# Patient Record
Sex: Male | Born: 1995 | Race: White | Hispanic: No
Health system: Western US, Academic
[De-identification: ages and names within clinical notes are randomized; demographics above are authoritative.]

## PROBLEM LIST (undated history)

## (undated) DEATH — deceased

---

## 2015-05-12 ENCOUNTER — Encounter (INDEPENDENT_AMBULATORY_CARE_PROVIDER_SITE_OTHER): Payer: No Typology Code available for payment source | Admitting: Family Practice

## 2015-05-12 ENCOUNTER — Ambulatory Visit (INDEPENDENT_AMBULATORY_CARE_PROVIDER_SITE_OTHER): Payer: No Typology Code available for payment source | Admitting: Family Practice

## 2015-05-12 ENCOUNTER — Ambulatory Visit (INDEPENDENT_AMBULATORY_CARE_PROVIDER_SITE_OTHER): Payer: No Typology Code available for payment source

## 2015-05-12 ENCOUNTER — Encounter (INDEPENDENT_AMBULATORY_CARE_PROVIDER_SITE_OTHER): Payer: Self-pay | Admitting: Family Practice

## 2015-05-12 VITALS — BP 130/73 | HR 71 | Temp 97.9°F | Ht 69.5 in | Wt 150.6 lb

## 2015-05-12 DIAGNOSIS — R002 Palpitations: Secondary | ICD-10-CM

## 2015-05-12 NOTE — Progress Notes (Signed)
Craig Newman is a 20 year old male here to discuss the following:   Chief Complaint   Patient presents with   . Other     patient is here c/o of palpatation started through out this second quarter. Experiencing everytime goes to bed laying down.        1 month hx of palpitations at rest and noticing heart beat more when he lays down to sleep.  He has had no chest pain.  No dizziness.  He does not notice symptoms if he exercises.  There is no fam hx of heart disease.    He drinks alcohol on weekends but not change in pattern.  No other drugs.  Caffeine once in the morning.    He has taken Guanfacine since childhood for tick.      Fam hx negative for hear disease.    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    He has been on triprolidone and pseudoephedrine combo since summer prescribed by his dermatologist for recurrent heat rash on chest.      Medications  Outpatient Prescriptions Marked as Taking for the 05/12/15 encounter (Office Visit) with Susy Frizzle., Sol Passer, MD   Medication Sig Dispense Refill   . GuanFACINE HCl 1 MG Oral Tab Take 1 mg by mouth 2 times a day.     . Triprolidine-Pseudoephedrine (ANTIHISTAMINE OR)          Social history:  History   Sexual Activity   . Sexual Activity: No     Social History   Substance Use Topics   . Smoking status: Never Smoker    . Smokeless tobacco: Never Used   . Alcohol Use: Yes      Comment: Occasionally       Objective:  BP 130/73 mmHg  Pulse 71  Temp(Src) 97.9 F (36.6 C) (Oral)  Ht 5' 9.5" (1.765 m)  Wt 68.312 kg (150 lb 9.6 oz)  BMI 21.93 kg/m2  Estimated body mass index is 21.93 kg/(m^2) as calculated from the following:    Height as of this encounter: 5' 9.5" (1.765 m).    Weight as of this encounter: 68.312 kg (150 lb 9.6 oz).  Extended Vitals not filed for this encounter.  Last blood pressure recorded in this encounter:  130/73  General:  NAD  Weight:  without significant change.  Chest:  Clear without rales or wheezes.  COR:   HR regular.  No murmur, gallop or rub.  No JVD.  No murmur in LLD position.  No murmur when standing  Extr: No edema.      Lab and other tests:  EKG normal    Assessment and Plan:  (R00.2) Palpitations  (primary encounter diagnosis)  Recurrent minor palpitations in the last month in patient who is on guanfacine for many years and on antihistamine-pseudoephedrine combination since last summer.  Importantly symptoms do not occur during activity and EKG is normal.  I do not thing he needs additional evaluation at this time.      Although not discussed at visit, I realize that he is taking pseudoephedrine in the antihistmine combination.  This may play a role even though he has been on this since summer without symptoms.  Pseudoephedrine is a heart stimulant and decongestant.  I advise switching to a different antihistamine.  He can contact his dermatologist or he could just take Zyrtec (cetrazine) 10 mg daily which is available without a prescription.  Thalia Bloodgood, MD  Clinical Professor   Department of [employer] [place]

## 2015-05-12 NOTE — Progress Notes (Signed)
SUBJECTIVE:This 20 year old male presents with c/o feeling his "heart speed up" in increasing amount since beginning of January.  States feeling is more pronounced when lays down for nap, however does not feel it when lays down at night.  Unable to say how long the feeling lasts or how many times/day it occurs.  Denies concomitant SOB, dizziness.  States yesterday he had some soreness in left side of chest when the feeling occurred.  Takes Guanfacine for eye tick.    OBJECTIVE:  Well appearing young man in NAD    ASSESSMENT:   Possible heart palpitations    PLAN:  Appointment scheduled for evaluations with Edrick Kins, MD today at 4:20pm.  Discussed parameters to seek care urgently.    Patient voiced understanding and agreement with plan.    Claudette Stapler, RN

## 2015-06-28 ENCOUNTER — Emergency Department
Admission: EM | Admit: 2015-06-28 | Discharge: 2015-06-29 | Disposition: A | Discharge status: Home / Self Care | Payer: No Typology Code available for payment source | Attending: Emergency Medicine | Admitting: Emergency Medicine

## 2015-06-28 DIAGNOSIS — S81812A Laceration without foreign body, left lower leg, initial encounter: Secondary | ICD-10-CM | POA: Insufficient documentation

## 2015-06-28 DIAGNOSIS — W25XXXA Contact with sharp glass, initial encounter: Secondary | ICD-10-CM

## 2015-06-28 DIAGNOSIS — M79662 Pain in left lower leg: Secondary | ICD-10-CM | POA: Insufficient documentation

## 2015-06-28 DIAGNOSIS — Z23 Encounter for immunization: Secondary | ICD-10-CM

## 2015-06-28 DIAGNOSIS — F10129 Alcohol abuse with intoxication, unspecified: Secondary | ICD-10-CM

## 2015-06-29 ENCOUNTER — Emergency Department (EMERGENCY_DEPARTMENT_HOSPITAL)
Admission: EM | Admit: 2015-06-29 | Discharge: 2015-06-29 | Payer: No Typology Code available for payment source | Source: Home / Self Care

## 2015-06-29 ENCOUNTER — Other Ambulatory Visit: Payer: Self-pay | Admitting: Emergency Medicine

## 2015-06-29 DIAGNOSIS — M79662 Pain in left lower leg: Secondary | ICD-10-CM

## 2015-06-30 ENCOUNTER — Emergency Department
Admission: EM | Admit: 2015-06-30 | Discharge: 2015-06-30 | Disposition: A | Discharge status: Home / Self Care | Payer: No Typology Code available for payment source | Attending: Emergency Medicine | Admitting: Emergency Medicine

## 2015-06-30 DIAGNOSIS — S81812D Laceration without foreign body, left lower leg, subsequent encounter: Secondary | ICD-10-CM

## 2015-06-30 DIAGNOSIS — W25XXXD Contact with sharp glass, subsequent encounter: Secondary | ICD-10-CM | POA: Insufficient documentation

## 2015-07-08 ENCOUNTER — Ambulatory Visit (INDEPENDENT_AMBULATORY_CARE_PROVIDER_SITE_OTHER): Payer: No Typology Code available for payment source

## 2015-07-08 NOTE — Progress Notes (Signed)
TRIAGE WOUND CARE/DRESSING CHANGE    SUBJECTIVE:  Craig Newman is a 20 year old male presents for wound care of Left leg.  Patient had sutures placed on 06/28/15, see mindscape for full details.  Patient reports that the wound continues to bleed and wondering if he can have [facility]    OBJECTIVE:   Wound is covered in crusted blood, begins to bleed with cleansing  6 sutures in place plus 2 mattress sutures   Wound is without redness or discharge  Tissue around wound is tender to palpation, +1 edema, healing bruise.    ASSESSMENT:   Suture care.    PLAN:    Wound was cleansed with normal saline.    Consulted with Dr. Delbert Phenixarle who advises that sutures stay in place x 14 days total.  Applied 3 steristrip to wound, leave in place.  Advised pt to begin pedal pulses, manual lymphatic drainage, increase fluid intake, wash wound daily in shower.  Pt will RTC on 07/12/15 for further evaluation and possible suture removal.  RTC sooner if any further concern.  Pt agreed to plan.    NE Lantryhaves, [place]

## 2015-07-12 ENCOUNTER — Ambulatory Visit (INDEPENDENT_AMBULATORY_CARE_PROVIDER_SITE_OTHER): Payer: No Typology Code available for payment source

## 2015-07-12 NOTE — Progress Notes (Signed)
SUTURE REMOVAL TEMPLATE for TRIAGE     SUBJECTIVE:  Craig Newman is a 20 year old male who presents for removal of sutures/staples on left lateral calf.  [facility] of [facility],  14 days ago. See mindscape for details.  Patient has had bleeding from sutures since onset.   2 mattress sutures placed 2 days after original interrupted sutures placed.    OBJECTIVE:  Today there are 6 interrupted and 2 mattress sutures/staples in place.    Wound is crusted in blood, after cleansing, noted that wound edges appear to be approximated  Minimal peri wound erythema as expected without exudate.    ASSESSMENT:  Healing wound.    PLAN:  Consulted with Dr. Merla Richesosellini, OK to remove sutures now.  All sutures/staples were removed without difficulty.    Small amount of bleeding after suture removal, staunched with pressure.  Applied vaseline to wound, applied steri-strips for extra hold, allow to fall off.  Reviewed continued self care instructions:Monitor for signs of infection (fever, pus at wound site +/- increasing redness).  No further follow up is anticipated at this time.    Pt voiced understanding and agreement with plan.    Sherilyn CooterNadine Elizabeth Chaves, RN

## 2017-04-24 ENCOUNTER — Ambulatory Visit (INDEPENDENT_AMBULATORY_CARE_PROVIDER_SITE_OTHER): Payer: No Typology Code available for payment source

## 2017-04-24 DIAGNOSIS — Z719 Counseling, unspecified: Secondary | ICD-10-CM

## 2017-04-24 DIAGNOSIS — Z23 Encounter for immunization: Secondary | ICD-10-CM

## 2017-04-24 NOTE — Progress Notes (Signed)
Craig Newman is here today to get his second HPV vaccine.  Vaccine Screening Questions    Interpreter: No    1. Are you allergic to Latex? NO    2.  Have you had a serious reaction or an allergic reaction to a vaccine?  NO    3.  Currently have a moderate or severe illness, including fever?  NO    4.  Ever had a seizure or any neurological problem associated with a vaccine? (DTaP/TDaP/DTP pertinent) NO    5.  Is patient receiving any live vaccinations today? (Varicella-Chickenpox, MMR-Measles/Mumps/Rubella, Zoster-Shingles, Flumist, Yellow Fever) NOTE: oral rotavirus is exempt  NO    If YES to any of the questions above - Do NOT give vaccine.  Consult with RN or provider in clinic.  (#5 can be YES if all Live vaccine questions are answered NO)    If NO to all questions above - Patient may receive vaccine.    6.  Do you need to receive the Flu vaccine today? NO    HPV 9-valent (Gardasil 9)    All patients are encouraged to wait 15 minutes before leaving after receiving any vaccine.    VIS given 03/22/2017 by Luvenia Heller, RN    Was Craig Newman alert, oriented, and without adverse reaction after the vaccine was given y/n: YES.

## 2017-09-19 ENCOUNTER — Encounter (INDEPENDENT_AMBULATORY_CARE_PROVIDER_SITE_OTHER): Payer: Self-pay

## 2018-02-06 ENCOUNTER — Ambulatory Visit (INDEPENDENT_AMBULATORY_CARE_PROVIDER_SITE_OTHER): Payer: No Typology Code available for payment source

## 2018-02-06 DIAGNOSIS — Z23 Encounter for immunization: Secondary | ICD-10-CM

## 2018-02-06 DIAGNOSIS — Z719 Counseling, unspecified: Secondary | ICD-10-CM

## 2018-02-06 NOTE — Progress Notes (Signed)
Vaccine Screening Questions      Does the patient have allergies to foods (particularly yeast and eggs), latex, gentamicin sulfate or any known vaccine component?  No    Has the patient had an opportunity to ask questions about the HPV and Flu vaccines?YES     Is the patient sick today? NO    Is the patient pregnant or planning to become pregnant in the next month? N/A    Have you had Guillan-Barre syndrome associated with a vaccine? No    Have you ever had a Flu vaccine? Yes    Have you ever had a serious reaction to influenza vaccine in the past? No    Has patient or guardian given consent and received the corresponding VIS statements? YES    Vaccine being given today: HPV 9  (Gardasil 9) &  Influenza Quadrivalent.    Adolescents are encouraged to wait 15 minutes after receiving vaccine before leaving.

## 2018-10-06 ENCOUNTER — Encounter (HOSPITAL_COMMUNITY): Payer: Self-pay

## 2018-10-06 ENCOUNTER — Other Ambulatory Visit: Payer: Self-pay

## 2018-10-06 ENCOUNTER — Inpatient Hospital Stay (HOSPITAL_COMMUNITY)
Admission: EM | Admit: 2018-10-06 | Discharge: 2018-10-08 | DRG: 494 | Disposition: A | Discharge status: Home Health | Payer: BC Managed Care – PPO | Attending: Orthopaedic Surgery | Admitting: Orthopaedic Surgery

## 2018-10-06 ENCOUNTER — Emergency Department (HOSPITAL_COMMUNITY): Payer: BC Managed Care – PPO

## 2018-10-06 DIAGNOSIS — S82202A Unspecified fracture of shaft of left tibia, initial encounter for closed fracture: Secondary | ICD-10-CM | POA: Diagnosis present

## 2018-10-06 DIAGNOSIS — Y9301 Activity, walking, marching and hiking: Secondary | ICD-10-CM | POA: Diagnosis present

## 2018-10-06 DIAGNOSIS — Z1159 Encounter for screening for other viral diseases: Secondary | ICD-10-CM | POA: Diagnosis not present

## 2018-10-06 DIAGNOSIS — F17210 Nicotine dependence, cigarettes, uncomplicated: Secondary | ICD-10-CM | POA: Diagnosis present

## 2018-10-06 DIAGNOSIS — S82492A Other fracture of shaft of left fibula, initial encounter for closed fracture: Secondary | ICD-10-CM | POA: Diagnosis present

## 2018-10-06 DIAGNOSIS — Z419 Encounter for procedure for purposes other than remedying health state, unspecified: Secondary | ICD-10-CM

## 2018-10-06 DIAGNOSIS — W1781XA Fall down embankment (hill), initial encounter: Secondary | ICD-10-CM

## 2018-10-06 DIAGNOSIS — S82242A Displaced spiral fracture of shaft of left tibia, initial encounter for closed fracture: Secondary | ICD-10-CM | POA: Diagnosis present

## 2018-10-06 LAB — CBC
HCT: 43.2 % (ref 39.0–52.0)
Hemoglobin: 15.5 g/dL (ref 13.0–17.0)
MCH: 31.2 pg (ref 26.0–34.0)
MCHC: 35.9 g/dL (ref 30.0–36.0)
MCV: 86.9 fL (ref 80.0–100.0)
Platelets: 256 10*3/uL (ref 150–400)
RBC: 4.97 MIL/uL (ref 4.22–5.81)
RDW: 11.6 % (ref 11.5–15.5)
WBC: 9.5 10*3/uL (ref 4.0–10.5)
nRBC: 0 % (ref 0.0–0.2)

## 2018-10-06 LAB — BASIC METABOLIC PANEL
Anion gap: 14 (ref 5–15)
BUN: 14 mg/dL (ref 6–20)
CO2: 23 mmol/L (ref 22–32)
Calcium: 9.3 mg/dL (ref 8.9–10.3)
Chloride: 102 mmol/L (ref 98–111)
Creatinine, Ser: 0.98 mg/dL (ref 0.61–1.24)
GFR calc Af Amer: >60 mL/min (ref >60)
GFR calc non Af Amer: >60 mL/min (ref >60)
Glucose, Bld: 109 mg/dL — ABNORMAL HIGH (ref 70–99)
Potassium: 4.2 mmol/L (ref 3.5–5.1)
Sodium: 139 mmol/L (ref 135–145)

## 2018-10-06 LAB — SARS CORONAVIRUS 2 BY RT PCR (HOSPITAL ORDER, PERFORMED IN ~~LOC~~ HOSPITAL LAB): SARS Coronavirus 2: NEGATIVE

## 2018-10-06 LAB — SURGICAL PCR SCREEN
MRSA, PCR: NEGATIVE
Staphylococcus aureus: NEGATIVE

## 2018-10-06 MED ORDER — ACETAMINOPHEN 500 MG PO TABS
1000.0000 mg | ORAL_TABLET | Freq: Four times a day (QID) | ORAL | Status: AC
  Administered 2018-10-06 – 2018-10-07 (×2): 1000 mg via ORAL
  Filled 2018-10-06: qty 2

## 2018-10-06 MED ORDER — CEFAZOLIN SODIUM-DEXTROSE 2-4 GM/100ML-% IV SOLN
2.0000 g | INTRAVENOUS | Status: AC
  Administered 2018-10-07: 2 g via INTRAVENOUS
  Filled 2018-10-06: qty 100

## 2018-10-06 MED ORDER — NAPROXEN 250 MG PO TABS
250.0000 mg | ORAL_TABLET | Freq: Two times a day (BID) | ORAL | Status: DC
  Administered 2018-10-06 – 2018-10-08 (×3): 250 mg via ORAL
  Filled 2018-10-06 (×5): qty 1

## 2018-10-06 MED ORDER — HYDROMORPHONE HCL 1 MG/ML IJ SOLN
0.5000 mg | INTRAMUSCULAR | Status: DC | PRN
  Administered 2018-10-07 (×2): 1 mg via INTRAVENOUS
  Filled 2018-10-06 (×2): qty 1

## 2018-10-06 MED ORDER — METHOCARBAMOL 1000 MG/10ML IJ SOLN
500.0000 mg | Freq: Four times a day (QID) | INTRAVENOUS | Status: DC | PRN
  Filled 2018-10-06: qty 5

## 2018-10-06 MED ORDER — MORPHINE SULFATE (PF) 4 MG/ML IV SOLN
4.0000 mg | Freq: Once | INTRAVENOUS | Status: AC
  Administered 2018-10-06: 11:00:00 4 mg via INTRAVENOUS
  Filled 2018-10-06: qty 1

## 2018-10-06 MED ORDER — OXYCODONE HCL 5 MG PO TABS
5.0000 mg | ORAL_TABLET | ORAL | Status: DC | PRN
  Administered 2018-10-06: 5 mg via ORAL
  Administered 2018-10-06 – 2018-10-08 (×5): 10 mg via ORAL
  Filled 2018-10-06: qty 1
  Filled 2018-10-06 (×5): qty 2

## 2018-10-06 MED ORDER — ACETAMINOPHEN 325 MG PO TABS: 325.0000 mg | ORAL_TABLET | Freq: Four times a day (QID) | ORAL | Status: DC | PRN

## 2018-10-06 MED ORDER — DOCUSATE SODIUM 100 MG PO CAPS
100.0000 mg | ORAL_CAPSULE | Freq: Two times a day (BID) | ORAL | Status: DC
  Administered 2018-10-06 – 2018-10-08 (×3): 100 mg via ORAL
  Filled 2018-10-06 (×3): qty 1

## 2018-10-06 MED ORDER — ONDANSETRON HCL 4 MG/2ML IJ SOLN: 4.0000 mg | Freq: Four times a day (QID) | INTRAMUSCULAR | Status: DC | PRN

## 2018-10-06 MED ORDER — ONDANSETRON HCL 4 MG PO TABS: 4.0000 mg | ORAL_TABLET | Freq: Four times a day (QID) | ORAL | Status: DC | PRN

## 2018-10-06 MED ORDER — METHOCARBAMOL 500 MG PO TABS
500.0000 mg | ORAL_TABLET | Freq: Four times a day (QID) | ORAL | Status: DC | PRN
  Administered 2018-10-06 – 2018-10-08 (×5): 500 mg via ORAL
  Filled 2018-10-06 (×5): qty 1

## 2018-10-06 MED ORDER — OXYCODONE HCL 5 MG PO TABS
10.0000 mg | ORAL_TABLET | ORAL | Status: DC | PRN
  Administered 2018-10-08 (×2): 15 mg via ORAL
  Filled 2018-10-06 (×2): qty 3

## 2018-10-06 NOTE — ED Triage Notes (Signed)
EMS reports Pt fell down hill, obvious deformity and swelling left lower leg. Denies LOC, striking head or any other pain  BP 150/90 HR 90 RR 18 Sp02 100 RA  20ga LAC 100 Fentanyl enroute

## 2018-10-06 NOTE — Anesthesia Preprocedure Evaluation (Addendum)
Anesthesia Evaluation  Patient identified by MRN, date of birth, ID band Patient awake    Reviewed: Allergy & Precautions, H&P , NPO status , Patient's Chart, lab work & pertinent test results  Airway Mallampati: II  TM Distance: >3 FB Neck ROM: Full    Dental no notable dental hx. (+) Teeth Intact, Dental Advisory Given   Pulmonary Current Smoker,    Pulmonary exam normal breath sounds clear to auscultation       Cardiovascular Exercise Tolerance: Good negative cardio ROS   Rhythm:Regular Rate:Normal     Neuro/Psych negative neurological ROS  negative psych ROS   GI/Hepatic negative GI ROS, Neg liver ROS,   Endo/Other  negative endocrine ROS  Renal/GU negative Renal ROS  negative genitourinary   Musculoskeletal   Abdominal   Peds  Hematology negative hematology ROS (+)   Anesthesia Other Findings   Reproductive/Obstetrics negative OB ROS                            Anesthesia Physical Anesthesia Plan  ASA: II  Anesthesia Plan: General   Post-op Pain Management:    Induction: Intravenous  PONV Risk Score and Plan: 2 and Ondansetron, Dexamethasone and Midazolam  Airway Management Planned: Oral ETT  Additional Equipment:   Intra-op Plan:   Post-operative Plan: Extubation in OR  Informed Consent: I have reviewed the patients History and Physical, chart, labs and discussed the procedure including the risks, benefits and alternatives for the proposed anesthesia with the patient or authorized representative who has indicated his/her understanding and acceptance.     Dental advisory given  Plan Discussed with: CRNA  Anesthesia Plan Comments:         Anesthesia Quick Evaluation

## 2018-10-06 NOTE — ED Notes (Signed)
ED TO INPATIENT HANDOFF REPORT  Name/Age/Gender Dennis Dixon 23 y.o. male  Code Status    Code Status Orders  (From admission, onward)         Start     Ordered   10/06/18 1053  Full code  Continuous     10/06/18 1055        Code Status History    This patient has a current code status but no historical code status.   Advance Care Planning Activity      Home/SNF/Other Home  Chief Complaint Fall; Ankle Injury  Level of Care/Admitting Diagnosis ED Disposition    ED Disposition Condition Comment   Admit  [facility] [[id number]]  Level of Care: Med-Surg [16]  Covid Evaluation: Asymptomatic Screening Protocol (No Symptoms)  Diagnosis: Left tibial fracture [[id number]][[id number]]  Admitting Physician: Terance HartADAIR, CHRISTOPHER R [[id number]]  Attending Physician: Terance HartDAIR, CHRISTOPHER R [[id number]]  Estimated length of stay: past midnight tomorrow  Certification:: I certify this patient will need inpatient services for at least 2 midnights  PT Class (Do Not Modify): Inpatient [101]  PT Acc Code (Do Not Modify): Private [1]       Medical History History reviewed. No pertinent past medical history.  Allergies Allergies  Allergen Reactions  . Amoxicillin Itching    IV Location/Drains/Wounds Patient Lines/Drains/Airways Status   Active Line/Drains/Airways    Name:   Placement date:   Placement time:   Site:   Days:   Peripheral IV 10/06/18 Left Antecubital   10/06/18    0936    Antecubital   less than 1          Labs/Imaging Results for orders placed or performed during the hospital encounter of 10/06/18 (from the past 48 hour(s))  CBC     Status: None   Collection Time: 10/06/18 10:53 AM  Result Value Ref Range   WBC 9.5 4.0 - 10.5 K/uL   RBC 4.97 4.22 - 5.81 MIL/uL   Hemoglobin 15.5 13.0 - 17.0 g/dL   HCT 29.543.2 62.139.0 - 30.852.0 %   MCV 86.9 80.0 - 100.0 fL   MCH 31.2 26.0 - 34.0 pg   MCHC 35.9 30.0 - 36.0 g/dL   RDW 65.711.6 84.611.5 - 96.215.5 %   Platelets 256  150 - 400 K/uL   nRBC 0.0 0.0 - 0.2 %    Comment: Performed at [facility], [address] [place], [place] [address]  Basic metabolic panel     Status: Abnormal   Collection Time: 10/06/18 10:53 AM  Result Value Ref Range   Sodium 139 135 - 145 mmol/L   Potassium 4.2 3.5 - 5.1 mmol/L   Chloride 102 98 - 111 mmol/L   CO2 23 22 - 32 mmol/L   Glucose, Bld 109 (H) 70 - 99 mg/dL   BUN 14 6 - 20 mg/dL   Creatinine, Ser 1.320.98 0.61 - 1.24 mg/dL   Calcium 9.3 8.9 - 44.010.3 mg/dL   GFR calc non Af Amer >60 >60 mL/min   GFR calc Af Amer >60 >60 mL/min   Anion gap 14 5 - 15    Comment: Performed at [facility], [address] [place], [place] [address]   Dg Tibia/fibula Left  Result Date: 10/06/2018 CLINICAL DATA:  Fall with left lower extremity pain EXAM: LEFT TIBIA AND FIBULA - 2 VIEW COMPARISON:  None. FINDINGS: Comminuted non articular oblique left distal tibial metadiaphysis fracture with 1 cm over riding and 9 mm  posterior/lateral displacement of the dominant distal fracture fragment. Comminuted oblique left distal fibula fracture extending into the lateral malleolus with 5 mm posterior displacement of the dominant distal fracture fragment and 1 cm overriding. No additional fractures. No dislocation at the knee or ankle. No suspicious focal osseous lesions. No radiopaque foreign body. Soft tissue swelling surrounding the fracture sites. IMPRESSION: Comminuted left distal tibial and left distal fibula fractures as detailed. Electronically Signed   By: Ilona Sorrel M.D.   On: 10/06/2018 10:22   Dg Ankle Complete Left  Result Date: 10/06/2018 CLINICAL DATA:  Fall today with left ankle pain EXAM: LEFT ANKLE COMPLETE - 3+ VIEW COMPARISON:  None. FINDINGS: Comminuted non articular spiral fracture of the distal metadiaphysis of the tibia with 1 cm over riding, 8 mm posterior and lateral displacement of the dominant distal fracture fragment and slight apex lateral  angulation. Comminuted oblique left distal fibula fracture extending into lateral malleolus with 4 mm posterior displacement of the dominant distal fracture fragment and 1 cm overriding. No subluxation at the left ankle joint. No suspicious focal osseous lesions. Soft tissue swelling surrounding the fracture sites. No radiopaque foreign body. IMPRESSION: 1. Comminuted spiral fracture of the distal left tibial metadiaphysis as detailed. 2. Comminuted oblique left distal fibula fracture as detailed. Electronically Signed   By: Ilona Sorrel M.D.   On: 10/06/2018 10:19    Pending Labs Unresulted Labs (From admission, onward)    Start     Ordered   10/06/18 1054  SARS Coronavirus 2 (CEPHEID - Performed in [place] hospital lab), Hosp Order  (Asymptomatic Patients Labs)  Once,   STAT    Question:  Rule Out  Answer:  Yes   10/06/18 1053   10/06/18 1053  HIV antibody (Routine Testing)  Once,   STAT     10/06/18 1055          Vitals/Pain Today's Vitals   10/06/18 0934 10/06/18 0935 10/06/18 1053  BP: (!) 152/108  (!) 148/98  Pulse: 89  88  Resp: 18  18  Temp: 98.2 F (36.8 C)    TempSrc: Oral    SpO2: 100%  99%  Weight:  86.2 kg   Height:  6\' 2"  (1.88 m)   PainSc:  7      Isolation Precautions No active isolations  Medications Medications  acetaminophen (TYLENOL) tablet 325-650 mg (has no administration in time range)  oxyCODONE (Oxy IR/ROXICODONE) immediate release tablet 5-10 mg (has no administration in time range)  oxyCODONE (Oxy IR/ROXICODONE) immediate release tablet 10-15 mg (has no administration in time range)  HYDROmorphone (DILAUDID) injection 0.5-1 mg (has no administration in time range)  acetaminophen (TYLENOL) tablet 1,000 mg (has no administration in time range)  naproxen (NAPROSYN) tablet 250 mg (has no administration in time range)  methocarbamol (ROBAXIN) tablet 500 mg (has no administration in time range)    Or  methocarbamol (ROBAXIN) 500 mg in dextrose 5 %  50 mL IVPB (has no administration in time range)  docusate sodium (COLACE) capsule 100 mg (has no administration in time range)  ondansetron (ZOFRAN) tablet 4 mg (has no administration in time range)    Or  ondansetron (ZOFRAN) injection 4 mg (has no administration in time range)  morphine 4 MG/ML injection 4 mg (4 mg Intravenous Given 10/06/18 1119)    Mobility non-ambulatory

## 2018-10-06 NOTE — Progress Notes (Signed)
1415 Received pt from [facility] via [place]. A&O x4, a little groggy. Left lower leg with spilnt dry and intact. Toes are warm. Notified Dr Lucia Gaskins of pt's arrival to the unit. Dr Lucia Gaskins was able to talk to the pt for the plan of care. I called pt's mother Renard Caperton and updated her of the plan of care.

## 2018-10-06 NOTE — ED Provider Notes (Signed)
Medical screening examination/treatment/procedure(s) were conducted as a shared visit with non-physician practitioner(s) and myself.  I personally evaluated the patient during the encounter.    23 year old male here after mechanical fall prior to arrival.  Has obvious deformity to his left lower extremity.  X-ray confirms a spiral tibial fracture.  Neurovascular intact at the left foot.  Discussed with Ortho will transfer for further management   Lacretia Leigh, MD 10/06/18 1056

## 2018-10-06 NOTE — ED Notes (Signed)
Bed: FM40 Expected date:  Expected time:  Means of arrival:  Comments: Need zap @ [id number]

## 2018-10-06 NOTE — ED Provider Notes (Signed)
[facility]-EMERGENCY DEPT Provider Note   CSN: [id number] Arrival date & time: 10/06/18  0920    History   Chief Complaint Chief Complaint  Patient presents with  . Leg Injury  . Fall    HPI Dennis Dixon is a 23 y.o. male without significant past medical history sending to the ED via EMS with left lower leg pain and deformity after mechanical fall.  Patient states he had picked up breakfast and was walking down a hill to his friend's apartment when he fell.  He states that he will was very steep and this caused him to fall.  He states he rolled his ankle but felt that something was immediately wrong.  He fell onto his bottom, he did not hit his head or pass out.  Is in a c-collar, however he states he has no pain in his neck or back.  He states he has sprained his ankle in the past, however no history of fracture.  100 mcg fentanyl provided in route provided improvement.  He denies any drug or alcohol use recently.     The history is provided by the patient.    History reviewed. No pertinent past medical history.  Patient Active Problem List   Diagnosis Date Noted  . Left tibial fracture 10/06/2018    History reviewed. No pertinent surgical history.      Home Medications    Prior to Admission medications   Not on File    Family History History reviewed. No pertinent family history.  Social History Social History   Tobacco Use  . Smoking status: Current Some Day Smoker  . Smokeless tobacco: Current User  Substance Use Topics  . Alcohol use: Not on file  . Drug use: Not on file     Allergies   Amoxicillin   Review of Systems Review of Systems  Musculoskeletal: Positive for arthralgias and joint swelling.  Neurological: Negative for numbness.  All other systems reviewed and are negative.    Physical Exam Updated Vital Signs BP (!) 148/98   Pulse 88   Temp 98.2 F (36.8 C) (Oral)   Resp 18   Ht 6\' 2"  (1.88 m)   Wt 86.2 kg    SpO2 99%   BMI 24.39 kg/m   Physical Exam Vitals signs and nursing note reviewed.  Constitutional:      Appearance: He is well-developed.  HENT:     Head: Normocephalic and atraumatic.  Eyes:     Conjunctiva/sclera: Conjunctivae normal.  Neck:     Musculoskeletal: Normal range of motion. No spinous process tenderness or muscular tenderness.     Comments: C-collar removed on evaluation by Dr. Freida BusmanAllen.  Patient is able to range neck in all directions without pain. Cardiovascular:     Rate and Rhythm: Normal rate and regular rhythm.  Pulmonary:     Effort: Pulmonary effort is normal. No respiratory distress.     Breath sounds: Normal breath sounds.  Abdominal:     General: Bowel sounds are normal.     Palpations: Abdomen is soft.     Tenderness: There is no abdominal tenderness. There is no guarding or rebound.  Musculoskeletal:     Comments: Pelvis is stable. There is deformity and swelling to LLE, just superior to the ankle and surrounding ankle. No wounds. Compartments feel soft. Achilles appears intact. Normal sensation to all digits. Intact DP pulses. Knee is nontender, no pain with passive flexion.  Skin:    General: Skin is  warm.  Neurological:     Mental Status: He is alert.  Psychiatric:        Behavior: Behavior normal.      ED Treatments / Results  Labs (all labs ordered are listed, but only abnormal results are displayed) Labs Reviewed  BASIC METABOLIC PANEL - Abnormal; Notable for the following components:      Result Value   Glucose, Bld 109 (*)    All other components within normal limits  SARS CORONAVIRUS 2 (HOSPITAL ORDER, PERFORMED IN [facility])  CBC  HIV ANTIBODY (ROUTINE TESTING W REFLEX)    EKG None  Radiology Dg Tibia/fibula Left  Result Date: 10/06/2018 CLINICAL DATA:  Fall with left lower extremity pain EXAM: LEFT TIBIA AND FIBULA - 2 VIEW COMPARISON:  None. FINDINGS: Comminuted non articular oblique left distal tibial  metadiaphysis fracture with 1 cm over riding and 9 mm posterior/lateral displacement of the dominant distal fracture fragment. Comminuted oblique left distal fibula fracture extending into the lateral malleolus with 5 mm posterior displacement of the dominant distal fracture fragment and 1 cm overriding. No additional fractures. No dislocation at the knee or ankle. No suspicious focal osseous lesions. No radiopaque foreign body. Soft tissue swelling surrounding the fracture sites. IMPRESSION: Comminuted left distal tibial and left distal fibula fractures as detailed. Electronically Signed   By: Delbert PhenixJason A Poff M.D.   On: 10/06/2018 10:22   Dg Ankle Complete Left  Result Date: 10/06/2018 CLINICAL DATA:  Fall today with left ankle pain EXAM: LEFT ANKLE COMPLETE - 3+ VIEW COMPARISON:  None. FINDINGS: Comminuted non articular spiral fracture of the distal metadiaphysis of the tibia with 1 cm over riding, 8 mm posterior and lateral displacement of the dominant distal fracture fragment and slight apex lateral angulation. Comminuted oblique left distal fibula fracture extending into lateral malleolus with 4 mm posterior displacement of the dominant distal fracture fragment and 1 cm overriding. No subluxation at the left ankle joint. No suspicious focal osseous lesions. Soft tissue swelling surrounding the fracture sites. No radiopaque foreign body. IMPRESSION: 1. Comminuted spiral fracture of the distal left tibial metadiaphysis as detailed. 2. Comminuted oblique left distal fibula fracture as detailed. Electronically Signed   By: Delbert PhenixJason A Poff M.D.   On: 10/06/2018 10:19    Procedures Procedures (including critical care time)  Medications Ordered in ED Medications  acetaminophen (TYLENOL) tablet 325-650 mg (has no administration in time range)  oxyCODONE (Oxy IR/ROXICODONE) immediate release tablet 5-10 mg (has no administration in time range)  oxyCODONE (Oxy IR/ROXICODONE) immediate release tablet 10-15 mg (has  no administration in time range)  HYDROmorphone (DILAUDID) injection 0.5-1 mg (has no administration in time range)  acetaminophen (TYLENOL) tablet 1,000 mg (has no administration in time range)  naproxen (NAPROSYN) tablet 250 mg (has no administration in time range)  methocarbamol (ROBAXIN) tablet 500 mg (has no administration in time range)    Or  methocarbamol (ROBAXIN) 500 mg in dextrose 5 % 50 mL IVPB (has no administration in time range)  docusate sodium (COLACE) capsule 100 mg (has no administration in time range)  ondansetron (ZOFRAN) tablet 4 mg (has no administration in time range)    Or  ondansetron (ZOFRAN) injection 4 mg (has no administration in time range)  morphine 4 MG/ML injection 4 mg (4 mg Intravenous Given 10/06/18 1119)     Initial Impression / Assessment and Plan / ED Course  I have reviewed the triage vital signs and the nursing notes.  Pertinent labs &  imaging results that were available during my care of the patient were reviewed by me and considered in my medical decision making (see chart for details).  Clinical Course as of Oct 05 1153  Sun Oct 06, 2018  1046 Discussed with Dr. Lucia Gaskins with orthopedics p who reviewed imaging.  Recommends we place ankle in well-padded short leg splint and transfer to [facility] for likely surgical correction today.  Appreciate consult   [JR]  1100 Compartments remain soft. Will redose pain medication. Pt aware of [facility].   [JR]    Clinical Course User Index [JR] Robinson, [place] N, PA-C       Patient with closed displaced spiral fractures of the left distal fibula and tibia after mechanical fall the prior to arrival.  No head trauma or other injuries reported.  On exam there is obvious deformity.  Neurovascularly intact.  Compartments are soft.  Dr. Lucia Gaskins with orthopedics recommends transfer to [facility] for likely surgical correction today.  Appreciate consult.  Patient placed in  well-padded short leg splint for transport. Dr. Tyrone Nine in ED is accepting physician.  Pt stable for transfer.  Final Clinical Impressions(s) / ED Diagnoses   Final diagnoses:  Closed fracture of left tibia and fibula, initial encounter    ED Discharge Orders    None       Robinson, [place] N, PA-C 10/06/18 1155    Lacretia Leigh, MD 10/07/18 [phone]

## 2018-10-07 ENCOUNTER — Encounter (HOSPITAL_COMMUNITY): Payer: Self-pay | Admitting: Certified Registered Nurse Anesthetist

## 2018-10-07 ENCOUNTER — Encounter (HOSPITAL_COMMUNITY)
Admission: EM | Disposition: A | Discharge status: Home Health | Payer: Self-pay | Source: Home / Self Care | Attending: Orthopaedic Surgery

## 2018-10-07 ENCOUNTER — Inpatient Hospital Stay (HOSPITAL_COMMUNITY): Payer: BC Managed Care – PPO | Admitting: Anesthesiology

## 2018-10-07 ENCOUNTER — Inpatient Hospital Stay (HOSPITAL_COMMUNITY): Payer: BC Managed Care – PPO

## 2018-10-07 HISTORY — PX: TIBIA IM NAIL INSERTION: SHX2516

## 2018-10-07 LAB — CBC
HCT: 40.2 % (ref 39.0–52.0)
Hemoglobin: 14.4 g/dL (ref 13.0–17.0)
MCH: 31.2 pg (ref 26.0–34.0)
MCHC: 35.8 g/dL (ref 30.0–36.0)
MCV: 87.2 fL (ref 80.0–100.0)
Platelets: 208 10*3/uL (ref 150–400)
RBC: 4.61 MIL/uL (ref 4.22–5.81)
RDW: 11.7 % (ref 11.5–15.5)
WBC: 11.7 10*3/uL — ABNORMAL HIGH (ref 4.0–10.5)
nRBC: 0 % (ref 0.0–0.2)

## 2018-10-07 LAB — CREATININE, SERUM
Creatinine, Ser: 1.1 mg/dL (ref 0.61–1.24)
GFR calc Af Amer: >60 mL/min (ref >60)
GFR calc non Af Amer: >60 mL/min (ref >60)

## 2018-10-07 LAB — HIV ANTIBODY (ROUTINE TESTING W REFLEX): HIV Screen 4th Generation wRfx: NONREACTIVE

## 2018-10-07 SURGERY — INSERTION, INTRAMEDULLARY ROD, TIBIA
Anesthesia: General | Site: Leg Lower | Laterality: Left

## 2018-10-07 MED ORDER — ONDANSETRON HCL 4 MG/2ML IJ SOLN
INTRAMUSCULAR | Status: AC
  Filled 2018-10-07: qty 2

## 2018-10-07 MED ORDER — ONDANSETRON HCL 4 MG/2ML IJ SOLN
INTRAMUSCULAR | Status: DC | PRN
  Administered 2018-10-07: 4 mg via INTRAVENOUS

## 2018-10-07 MED ORDER — DOCUSATE SODIUM 100 MG PO CAPS: 100.0000 mg | ORAL_CAPSULE | Freq: Two times a day (BID) | ORAL | Status: DC

## 2018-10-07 MED ORDER — SUCCINYLCHOLINE CHLORIDE 200 MG/10ML IV SOSY
PREFILLED_SYRINGE | INTRAVENOUS | Status: AC
  Filled 2018-10-07: qty 10

## 2018-10-07 MED ORDER — MIDAZOLAM HCL 2 MG/2ML IJ SOLN
INTRAMUSCULAR | Status: DC | PRN
  Administered 2018-10-07: 2 mg via INTRAVENOUS

## 2018-10-07 MED ORDER — ENOXAPARIN SODIUM 40 MG/0.4ML ~~LOC~~ SOLN
40.0000 mg | SUBCUTANEOUS | Status: DC
  Administered 2018-10-08: 40 mg via SUBCUTANEOUS
  Filled 2018-10-07: qty 0.4

## 2018-10-07 MED ORDER — LACTATED RINGERS IV SOLN
INTRAVENOUS | Status: DC | PRN
  Administered 2018-10-07 (×2): via INTRAVENOUS

## 2018-10-07 MED ORDER — HYDRALAZINE HCL 20 MG/ML IJ SOLN
INTRAMUSCULAR | Status: AC
  Administered 2018-10-07: 5 mg via INTRAVENOUS
  Filled 2018-10-07: qty 1

## 2018-10-07 MED ORDER — LIDOCAINE 2% (20 MG/ML) 5 ML SYRINGE
INTRAMUSCULAR | Status: DC | PRN
  Administered 2018-10-07: 100 mg via INTRAVENOUS

## 2018-10-07 MED ORDER — ESMOLOL HCL 100 MG/10ML IV SOLN
INTRAVENOUS | Status: AC
  Filled 2018-10-07: qty 10

## 2018-10-07 MED ORDER — ACETAMINOPHEN 10 MG/ML IV SOLN
INTRAVENOUS | Status: AC
  Filled 2018-10-07: qty 100

## 2018-10-07 MED ORDER — 0.9 % SODIUM CHLORIDE (POUR BTL) OPTIME
TOPICAL | Status: DC | PRN
  Administered 2018-10-07: 1000 mL

## 2018-10-07 MED ORDER — HYDROMORPHONE HCL 1 MG/ML IJ SOLN
INTRAMUSCULAR | Status: AC
  Administered 2018-10-07: 0.5 mg via INTRAVENOUS
  Filled 2018-10-07: qty 1

## 2018-10-07 MED ORDER — CEFAZOLIN SODIUM-DEXTROSE 2-4 GM/100ML-% IV SOLN
2.0000 g | Freq: Four times a day (QID) | INTRAVENOUS | Status: AC
  Administered 2018-10-07 (×3): 2 g via INTRAVENOUS
  Filled 2018-10-07 (×3): qty 100

## 2018-10-07 MED ORDER — ESMOLOL HCL 100 MG/10ML IV SOLN
INTRAVENOUS | Status: DC | PRN
  Administered 2018-10-07 (×2): 10 mg via INTRAVENOUS

## 2018-10-07 MED ORDER — DIPHENHYDRAMINE HCL 50 MG/ML IJ SOLN
INTRAMUSCULAR | Status: DC | PRN
  Administered 2018-10-07: 12.5 mg via INTRAVENOUS

## 2018-10-07 MED ORDER — MIDAZOLAM HCL 2 MG/2ML IJ SOLN
INTRAMUSCULAR | Status: AC
  Filled 2018-10-07: qty 2

## 2018-10-07 MED ORDER — ACETAMINOPHEN 10 MG/ML IV SOLN
INTRAVENOUS | Status: DC | PRN
  Administered 2018-10-07: 1000 mg via INTRAVENOUS

## 2018-10-07 MED ORDER — DEXMEDETOMIDINE HCL IN NACL 200 MCG/50ML IV SOLN
INTRAVENOUS | Status: DC | PRN
  Administered 2018-10-07: 12 ug via INTRAVENOUS
  Administered 2018-10-07: 8 ug via INTRAVENOUS

## 2018-10-07 MED ORDER — DIPHENHYDRAMINE HCL 50 MG/ML IJ SOLN
INTRAMUSCULAR | Status: AC
  Filled 2018-10-07: qty 1

## 2018-10-07 MED ORDER — DEXAMETHASONE SODIUM PHOSPHATE 10 MG/ML IJ SOLN
INTRAMUSCULAR | Status: AC
  Filled 2018-10-07: qty 1

## 2018-10-07 MED ORDER — PROPOFOL 10 MG/ML IV BOLUS
INTRAVENOUS | Status: AC
  Filled 2018-10-07: qty 40

## 2018-10-07 MED ORDER — PROPOFOL 10 MG/ML IV BOLUS
INTRAVENOUS | Status: DC | PRN
  Administered 2018-10-07: 200 mg via INTRAVENOUS

## 2018-10-07 MED ORDER — FENTANYL CITRATE (PF) 250 MCG/5ML IJ SOLN
INTRAMUSCULAR | Status: AC
  Filled 2018-10-07: qty 5

## 2018-10-07 MED ORDER — METOCLOPRAMIDE HCL 5 MG/ML IJ SOLN: 5.0000 mg | Freq: Three times a day (TID) | INTRAMUSCULAR | Status: DC | PRN

## 2018-10-07 MED ORDER — METOCLOPRAMIDE HCL 5 MG PO TABS: 5.0000 mg | ORAL_TABLET | Freq: Three times a day (TID) | ORAL | Status: DC | PRN

## 2018-10-07 MED ORDER — SUCCINYLCHOLINE CHLORIDE 200 MG/10ML IV SOSY
PREFILLED_SYRINGE | INTRAVENOUS | Status: DC | PRN
  Administered 2018-10-07: 120 mg via INTRAVENOUS

## 2018-10-07 MED ORDER — ROCURONIUM BROMIDE 10 MG/ML (PF) SYRINGE
PREFILLED_SYRINGE | INTRAVENOUS | Status: DC | PRN
  Administered 2018-10-07: 10 mg via INTRAVENOUS

## 2018-10-07 MED ORDER — HYDROMORPHONE HCL 1 MG/ML IJ SOLN
0.2500 mg | INTRAMUSCULAR | Status: DC | PRN
  Administered 2018-10-07 (×2): 0.5 mg via INTRAVENOUS

## 2018-10-07 MED ORDER — DEXAMETHASONE SODIUM PHOSPHATE 10 MG/ML IJ SOLN
INTRAMUSCULAR | Status: DC | PRN
  Administered 2018-10-07: 10 mg via INTRAVENOUS

## 2018-10-07 MED ORDER — ACETAMINOPHEN 500 MG PO TABS
1000.0000 mg | ORAL_TABLET | Freq: Once | ORAL | Status: AC
  Administered 2018-10-07: 1000 mg via ORAL
  Filled 2018-10-07: qty 2

## 2018-10-07 MED ORDER — FENTANYL CITRATE (PF) 250 MCG/5ML IJ SOLN
INTRAMUSCULAR | Status: DC | PRN
  Administered 2018-10-07: 50 ug via INTRAVENOUS
  Administered 2018-10-07: 150 ug via INTRAVENOUS
  Administered 2018-10-07: 50 ug via INTRAVENOUS

## 2018-10-07 MED ORDER — LIDOCAINE 2% (20 MG/ML) 5 ML SYRINGE
INTRAMUSCULAR | Status: AC
  Filled 2018-10-07: qty 5

## 2018-10-07 MED ORDER — HYDRALAZINE HCL 20 MG/ML IJ SOLN
5.0000 mg | Freq: Once | INTRAMUSCULAR | Status: AC
  Administered 2018-10-07: 10:00:00 5 mg via INTRAVENOUS

## 2018-10-07 SURGICAL SUPPLY — 75 items
BANDAGE ACE 4X5 VEL STRL LF (GAUZE/BANDAGES/DRESSINGS) IMPLANT
BANDAGE ACE 6X5 VEL STRL LF (GAUZE/BANDAGES/DRESSINGS) IMPLANT
BANDAGE ELASTIC 6 VELCRO ST LF (GAUZE/BANDAGES/DRESSINGS) ×3 IMPLANT
BANDAGE ESMARK 6X9 LF (GAUZE/BANDAGES/DRESSINGS) ×1 IMPLANT
BIT DRILL CALIBRATED 4.3X320MM (BIT) ×1 IMPLANT
BIT DRILL CROWE POINT TWST 4.3 (DRILL) ×1 IMPLANT
BNDG COHESIVE 4X5 TAN STRL (GAUZE/BANDAGES/DRESSINGS) ×3 IMPLANT
BNDG ESMARK 6X9 LF (GAUZE/BANDAGES/DRESSINGS) ×3
BNDG GAUZE ELAST 4 BULKY (GAUZE/BANDAGES/DRESSINGS) IMPLANT
CANISTER SUCTION 2500CC (MISCELLANEOUS) ×3 IMPLANT
COVER SURGICAL LIGHT HANDLE (MISCELLANEOUS) ×3 IMPLANT
COVER WAND RF STERILE (DRAPES) IMPLANT
CUFF TOURN SGL QUICK 34 (TOURNIQUET CUFF) ×2
CUFF TOURNIQUET SINGLE 44IN (TOURNIQUET CUFF) IMPLANT
CUFF TRNQT CYL 34X4.125X (TOURNIQUET CUFF) ×1 IMPLANT
DRAPE C-ARM 42X72 X-RAY (DRAPES) ×3 IMPLANT
DRAPE C-ARMOR (DRAPES) ×3 IMPLANT
DRAPE EXTREMITY T 121X128X90 (DISPOSABLE) ×3 IMPLANT
DRAPE IMP U-DRAPE 54X76 (DRAPES) ×6 IMPLANT
DRAPE INCISE IOBAN 66X45 STRL (DRAPES) ×3 IMPLANT
DRAPE ORTHO SPLIT 77X108 STRL (DRAPES) ×4
DRAPE SURG ORHT 6 SPLT 77X108 (DRAPES) ×2 IMPLANT
DRAPE U-SHAPE 47X51 STRL (DRAPES) ×3 IMPLANT
DRILL CALIBRATED 4.3X320MM (BIT) ×3
DRILL CROWE POINT TWIST 4.3 (DRILL) ×3
DURAPREP 26ML APPLICATOR (WOUND CARE) ×3 IMPLANT
ELECT REM PT RETURN 9FT ADLT (ELECTROSURGICAL) ×3
ELECTRODE REM PT RTRN 9FT ADLT (ELECTROSURGICAL) ×1 IMPLANT
EVACUATOR 1/8 PVC DRAIN (DRAIN) IMPLANT
FACESHIELD OPICON LG (MASK) IMPLANT
GAUZE SPONGE 4X4 12PLY STRL (GAUZE/BANDAGES/DRESSINGS) IMPLANT
GAUZE SPONGE 4X4 12PLY STRL LF (GAUZE/BANDAGES/DRESSINGS) ×3 IMPLANT
GAUZE XEROFORM 1X8 LF (GAUZE/BANDAGES/DRESSINGS) ×3 IMPLANT
GLOVE BIO SURGEON STRL SZ 6.5 (GLOVE) ×2 IMPLANT
GLOVE BIO SURGEON STRL SZ7 (GLOVE) ×3 IMPLANT
GLOVE BIO SURGEONS STRL SZ 6.5 (GLOVE) ×1
GLOVE BIOGEL M STRL SZ7.5 (GLOVE) ×3 IMPLANT
GLOVE BIOGEL PI IND STRL 7.5 (GLOVE) ×1 IMPLANT
GLOVE BIOGEL PI IND STRL 8 (GLOVE) ×1 IMPLANT
GLOVE BIOGEL PI INDICATOR 7.5 (GLOVE) ×2
GLOVE BIOGEL PI INDICATOR 8 (GLOVE) ×2
GLOVE ORTHO TXT STRL SZ7.5 (GLOVE) ×3 IMPLANT
GLOVE SURG ORTHO 8.0 STRL STRW (GLOVE) IMPLANT
GOWN STRL REUS W/ TWL LRG LVL3 (GOWN DISPOSABLE) ×1 IMPLANT
GOWN STRL REUS W/ TWL XL LVL3 (GOWN DISPOSABLE) ×1 IMPLANT
GOWN STRL REUS W/TWL LRG LVL3 (GOWN DISPOSABLE) ×2
GOWN STRL REUS W/TWL XL LVL3 (GOWN DISPOSABLE) ×2
GUIDEPIN 3.2X17.5 THRD DISP (PIN) ×3 IMPLANT
GUIDEWIRE 2.6X80 BEAD TIP (WIRE) ×1 IMPLANT
GUIDWIRE 2.6X80 BEAD TIP (WIRE) ×3
KIT BASIN OR (CUSTOM PROCEDURE TRAY) ×3 IMPLANT
KIT TURNOVER KIT B (KITS) ×3 IMPLANT
NAIL TIBIAL PHOENIX 9.0X380 (Nail) ×3 IMPLANT
NAIL TIBIAL PHOENIX 9.0X390MM (Nail) ×3 IMPLANT
PACK GENERAL/GYN (CUSTOM PROCEDURE TRAY) ×3 IMPLANT
PACK UNIVERSAL I (CUSTOM PROCEDURE TRAY) ×3 IMPLANT
PAD ARMBOARD 7.5X6 YLW CONV (MISCELLANEOUS) ×6 IMPLANT
PADDING CAST COTTON 6X4 STRL (CAST SUPPLIES) ×3 IMPLANT
SCREW CORT TI DBL LEAD 5X38 (Screw) ×3 IMPLANT
SCREW CORT TI DBL LEAD 5X40 (Screw) ×3 IMPLANT
SCREW CORT TI DBL LEAD 5X42 (Screw) ×3 IMPLANT
SCREW CORT TI DBL LEAD 5X46 (Screw) ×3 IMPLANT
SCREW CORT TI DBL LEAD 5X48 (Screw) ×3 IMPLANT
STAPLER VISISTAT 35W (STAPLE) ×3 IMPLANT
STOCKINETTE IMPERVIOUS 9X36 MD (GAUZE/BANDAGES/DRESSINGS) ×3 IMPLANT
SUT MNCRL AB 3-0 PS2 18 (SUTURE) ×6 IMPLANT
SUT PDS AB 2-0 CT1 27 (SUTURE) ×3 IMPLANT
SUT VIC AB 1 CTB1 27 (SUTURE) ×3 IMPLANT
SUT VIC AB 2-0 CT1 27 (SUTURE)
SUT VIC AB 2-0 CT1 TAPERPNT 27 (SUTURE) IMPLANT
TOWEL GREEN STERILE (TOWEL DISPOSABLE) ×3 IMPLANT
TOWEL GREEN STERILE FF (TOWEL DISPOSABLE) ×3 IMPLANT
TUBE CONNECTING 12'X1/4 (SUCTIONS) ×1
TUBE CONNECTING 12X1/4 (SUCTIONS) ×2 IMPLANT
YANKAUER SUCT BULB TIP NO VENT (SUCTIONS) ×3 IMPLANT

## 2018-10-07 NOTE — H&P (Signed)
Dennis Dixon is an 23 y.o. male.   Chief Complaint: Left leg pain with distal tibia and fibula fracture HPI: Patient was walking down an embankment and twisted his left leg yesterday.  He has a history of recurrent ankle sprains but was unable to ambulate afterwards.  He was brought to the emergency department and diagnosed with a distal tibia and fibula fracture that was spiral and extra-articular.  Given the nature of his injury orthopedics was consulted for admission and evaluation.  On my evaluation patient complains of pain in the left leg.  He denies numbness or tingling in his foot.  He denies any other joint or extremity pain.  The pain is sharp in quality.  It is better with pain medicine and splint immobilization.  Of note, patient endorses occasional cigarette smoking.  Denies any heart disease, lung disease, diabetes, cancers or strokes.    History reviewed. No pertinent past medical history.  History reviewed. No pertinent surgical history.  History reviewed. No pertinent family history. Social History:  reports that he has been smoking. He uses smokeless tobacco. No history on file for alcohol and drug.  Allergies:  Allergies  Allergen Reactions  . Amoxicillin Itching    No medications prior to admission.    Results for orders placed or performed during the hospital encounter of 10/06/18 (from the past 48 hour(s))  CBC     Status: None   Collection Time: 10/06/18 10:53 AM  Result Value Ref Range   WBC 9.5 4.0 - 10.5 K/uL   RBC 4.97 4.22 - 5.81 MIL/uL   Hemoglobin 15.5 13.0 - 17.0 g/dL   HCT 16.143.2 09.639.0 - 04.552.0 %   MCV 86.9 80.0 - 100.0 fL   MCH 31.2 26.0 - 34.0 pg   MCHC 35.9 30.0 - 36.0 g/dL   RDW 40.911.6 81.111.5 - 91.415.5 %   Platelets 256 150 - 400 K/uL   nRBC 0.0 0.0 - 0.2 %    Comment: Performed at [facility], [address] [place], [place] [address]  Basic metabolic panel     Status: Abnormal   Collection Time: 10/06/18 10:53 AM  Result Value  Ref Range   Sodium 139 135 - 145 mmol/L   Potassium 4.2 3.5 - 5.1 mmol/L   Chloride 102 98 - 111 mmol/L   CO2 23 22 - 32 mmol/L   Glucose, Bld 109 (H) 70 - 99 mg/dL   BUN 14 6 - 20 mg/dL   Creatinine, Ser 6.210.98 0.61 - 1.24 mg/dL   Calcium 9.3 8.9 - 30.810.3 mg/dL   GFR calc non Af Amer >60 >60 mL/min   GFR calc Af Amer >60 >60 mL/min   Anion gap 14 5 - 15    Comment: Performed at [facility], [address] [place], [place] [address]  SARS Coronavirus 2 (CEPHEID - Performed in [facility] hospital lab), Hosp Order     Status: None   Collection Time: 10/06/18 10:54 AM   Specimen: Nasopharyngeal Swab  Result Value Ref Range   SARS Coronavirus 2 NEGATIVE NEGATIVE    Comment: (NOTE) If result is NEGATIVE SARS-CoV-2 target nucleic acids are NOT DETECTED. The SARS-CoV-2 RNA is generally detectable in upper and lower  respiratory specimens during the acute phase of infection. The lowest  concentration of SARS-CoV-2 viral copies this assay can detect is 250  copies / mL. A negative result does not preclude SARS-CoV-2 infection  and should not be used as the sole basis  for treatment or other  patient management decisions.  A negative result may occur with  improper specimen collection / handling, submission of specimen other  than nasopharyngeal swab, presence of viral mutation(s) within the  areas targeted by this assay, and inadequate number of viral copies  (<250 copies / mL). A negative result must be combined with clinical  observations, patient history, and epidemiological information. If result is POSITIVE SARS-CoV-2 target nucleic acids are DETECTED. The SARS-CoV-2 RNA is generally detectable in upper and lower  respiratory specimens dur ing the acute phase of infection.  Positive  results are indicative of active infection with SARS-CoV-2.  Clinical  correlation with patient history and other diagnostic information is  necessary to determine patient infection  status.  Positive results do  not rule out bacterial infection or co-infection with other viruses. If result is PRESUMPTIVE POSTIVE SARS-CoV-2 nucleic acids MAY BE PRESENT.   A presumptive positive result was obtained on the submitted specimen  and confirmed on repeat testing.  While 2019 novel coronavirus  (SARS-CoV-2) nucleic acids may be present in the submitted sample  additional confirmatory testing may be necessary for epidemiological  and / or clinical management purposes  to differentiate between  SARS-CoV-2 and other Sarbecovirus currently known to infect humans.  If clinically indicated additional testing with an alternate test  methodology [phone]) is advised. The SARS-CoV-2 RNA is generally  detectable in upper and lower respiratory sp ecimens during the acute  phase of infection. The expected result is Negative. Fact Sheet for Patients:  StrictlyIdeas.no Fact Sheet for Healthcare Providers: BankingDealers.co.za This test is not yet approved or cleared by the [place] FDA and has been authorized for detection and/or diagnosis of SARS-CoV-2 by FDA under an Emergency Use Authorization (EUA).  This EUA will remain in effect (meaning this test can be used) for the duration of the COVID-19 declaration under Section 564(b)(1) of the Act, 21 U.S.C. section 360bbb-3(b)(1), unless the authorization is terminated or revoked sooner. Performed at [facility], [place] [address], [place], [place] [address]   Surgical pcr screen     Status: None   Collection Time: 10/06/18  5:25 PM   Specimen: Nasal Mucosa; Nasal Swab  Result Value Ref Range   MRSA, PCR NEGATIVE NEGATIVE   Staphylococcus aureus NEGATIVE NEGATIVE    Comment: (NOTE) The Xpert SA Assay (FDA approved for NASAL specimens in patients 68 years of age and older), is one component of a comprehensive surveillance program. It is not intended to diagnose  infection nor to guide or monitor treatment. Performed at [facility], [place] [address] [place], [place] [address]    Dg Tibia/fibula Left  Result Date: 10/06/2018 CLINICAL DATA:  Fall with left lower extremity pain EXAM: LEFT TIBIA AND FIBULA - 2 VIEW COMPARISON:  None. FINDINGS: Comminuted non articular oblique left distal tibial metadiaphysis fracture with 1 cm over riding and 9 mm posterior/lateral displacement of the dominant distal fracture fragment. Comminuted oblique left distal fibula fracture extending into the lateral malleolus with 5 mm posterior displacement of the dominant distal fracture fragment and 1 cm overriding. No additional fractures. No dislocation at the knee or ankle. No suspicious focal osseous lesions. No radiopaque foreign body. Soft tissue swelling surrounding the fracture sites. IMPRESSION: Comminuted left distal tibial and left distal fibula fractures as detailed. Electronically Signed   By: Ilona Sorrel M.D.   On: 10/06/2018 10:22   Dg Ankle Complete Left  Result Date: 10/06/2018 CLINICAL DATA:  Fall today with left ankle pain EXAM: LEFT ANKLE COMPLETE - 3+ VIEW COMPARISON:  None. FINDINGS: Comminuted non articular spiral fracture of the distal metadiaphysis of the tibia with 1 cm over riding, 8 mm posterior and lateral displacement of the dominant distal fracture fragment and slight apex lateral angulation. Comminuted oblique left distal fibula fracture extending into lateral malleolus with 4 mm posterior displacement of the dominant distal fracture fragment and 1 cm overriding. No subluxation at the left ankle joint. No suspicious focal osseous lesions. Soft tissue swelling surrounding the fracture sites. No radiopaque foreign body. IMPRESSION: 1. Comminuted spiral fracture of the distal left tibial metadiaphysis as detailed. 2. Comminuted oblique left distal fibula fracture as detailed. Electronically Signed   By: Delbert PhenixJason A Poff M.D.   On: 10/06/2018 10:19     Review of Systems  Constitutional: Negative.   HENT: Negative.   Eyes: Negative.   Respiratory: Negative.   Cardiovascular: Negative.   Gastrointestinal: Negative.   Musculoskeletal:       Left leg pain  Skin: Negative.   Neurological: Negative.   Psychiatric/Behavioral: Negative.     Blood pressure (!) 137/99, pulse 69, temperature 97.9 F (36.6 C), temperature source Oral, resp. rate 17, height 6\' 2"  (1.88 m), weight 86.2 kg, SpO2 100 %. Physical Exam  Constitutional: He appears well-developed.  HENT:  Head: Normocephalic.  Eyes: Conjunctivae are normal.  Neck: Neck supple.  Cardiovascular: Normal rate.  Respiratory: Effort normal.  GI: Soft.  Musculoskeletal:     Comments: Left leg in a short leg splint.  Toes exposed are warm and well-perfused.  No tenderness palpation proximal to the splint.  Accessible compartments are soft and compressible.  No pain with passive hallux stretch.  He is able to wiggle his toes.  No tenderness palpation about the knee, thigh or with logroll of the hip.  Active hip motion intact on the left lower extremity.  Palpable dorsalis pedis pulse within the splint.    No evidence of right lower extremity or bilateral upper extremity injury.  Neurological: He is alert.  Skin: Skin is warm.  Psychiatric: He has a normal mood and affect.     Assessment/Plan Patient has a distal tibia and fibula fracture that is spiral in nature and extra-articular.  He is indicated for open treatment of his fractures.  We will plan for intramedullary nail fixation.  We will address the fibula to ensure that it is out to length and appropriately reduced.  May assess ankle stability after fixation to ensure no syndesmotic disruption.  Patient does smoke cigarettes infrequently and I told him it would be in his best interest to stop smoking altogether.  This will reduce the complication rate surrounding surgery with regard to fracture healing and wound complications.  We  discussed the risk, benefits and alternatives surgery which include but are not limited to wound healing complications, infection, nonunion, malunion, need for further surgery, demonstrating structures as well as the perioperative and anesthetic risk which include death.  We also discussed the perioperative weightbearing restrictions and he agrees to comply.   Terance Harthristopher R Richerd Grime, MD 10/07/2018, 6:54 AM

## 2018-10-07 NOTE — Evaluation (Signed)
Physical Therapy Evaluation Patient Details Name: Dennis Dixon MRN: [id number] DOB: November 06, 1995 Today's Date: 10/07/2018   History of Present Illness  23 y.o. male admitted on 10/06/18 after sustaining a L ankle tibia and figular fx (spiral and extra articular) when he was walking down an embankment and twisted his leg.  Pt underwent IM nail on 10/07/18 and is TDWB in CAM boot post op.  Pt with significant PMH of L ankle sprain.   Clinical Impression  Pt was able to get up and hop a short distance around his room on crutches, however, is still feeling the effects from surgery earlier today and would benefit from an overnight stay as well as another therapy session to practice stairs and finish reviewing his HEP.   PT to follow acutely for deficits listed below.      Follow Up Recommendations Home health PT    Equipment Recommendations  Crutches    Recommendations for Other Services   NA    Precautions / Restrictions Precautions Precautions: Fall Precaution Comments: due to TDWB status Required Braces or Orthoses: Other Brace Other Brace: CAM boot-none ordered, pt reports he has one at home and will get family to bring it up this evening.  Restrictions Weight Bearing Restrictions: Yes LLE Weight Bearing: Touchdown weight bearing      Mobility  Bed Mobility Overal bed mobility: Needs Assistance Bed Mobility: Supine to Sit     Supine to sit: Min assist;HOB elevated     General bed mobility comments: Min assist mostly to help progress left leg to EOB, pt very guarded during transition.  Pt with HOB elevated and using railing for leverage at trunk.   Transfers Overall transfer level: Needs assistance Equipment used: Crutches Transfers: Sit to/from Stand Sit to Stand: Min assist         General transfer comment: Min assist to steady him in standing, pass him the crutches and ensure correct fit.   Ambulation/Gait Ambulation/Gait assistance: Min assist;Min guard Gait Distance  (Feet): 8 Feet Assistive device: Crutches Gait Pattern/deviations: Step-to pattern(hop to)     General Gait Details: Pt was able to maintain NWB on his left leg (encouraged NWB until we get the CAM boot up here).  assist for balance, visual demonstration of hop to gait pattern on crutches.          Balance Overall balance assessment: Needs assistance Sitting-balance support: Feet supported;No upper extremity supported Sitting balance-Leahy Scale: Good     Standing balance support: Bilateral upper extremity supported Standing balance-Leahy Scale: Poor Standing balance comment: needs assist in standing.                              Pertinent Vitals/Pain Pain Assessment: 0-10 Pain Score: 9  Pain Location: left lower leg Pain Descriptors / Indicators: Aching;Constant;Burning Pain Intervention(s): Limited activity within patient's tolerance;Monitored during session;Repositioned    Home Living Family/patient expects to be discharged to:: Private residence Living Arrangements: Parent(mom and dad) Available Help at Discharge: Family;Available PRN/intermittently(mom and dad work) Type of Home: House Home Access: Stairs to enter Entrance Stairs-Rails: None Secretary/administratorntrance Stairs-Number of Steps: 9(5 steps then 4 steps) Home Layout: One level Home Equipment: (has a CAM boot)      Prior Function Level of Independence: Independent         Comments: [employer]- works, just started        [employer] RxExtremity/Trunk Assessment   Upper Extremity Assessment Upper Extremity Assessment: Defer to  OT evaluation    Lower Extremity Assessment Lower Extremity Assessment: LLE deficits/detail LLE Deficits / Details: left leg with normal post op pain and weakness.  able to wiggle toes, ankle trace, knee 2-/5 hip 2/5 LLE Sensation: WNL    Cervical / Trunk Assessment Cervical / Trunk Assessment: Normal  Communication   Communication: No difficulties  Cognition Arousal/Alertness:  Awake/alert Behavior During Therapy: WFL for tasks assessed/performed Overall Cognitive Status: Within Functional Limits for tasks assessed                                           Exercises Total Joint Exercises Ankle Circles/Pumps: AROM;Right;20 reps Quad Sets: AROM;Left;10 reps Towel Squeeze: AROM;Both;10 reps Heel Slides: AAROM;Left;10 reps   Assessment/Plan    PT Assessment Patient needs continued PT services  PT Problem List Decreased strength;Decreased range of motion;Decreased activity tolerance;Decreased balance;Decreased mobility;Decreased knowledge of use of DME;Decreased knowledge of precautions;Pain       PT Treatment Interventions DME instruction;Gait training;Stair training;Functional mobility training;Therapeutic exercise;Therapeutic activities;Balance training;Patient/family education;Manual techniques;Modalities    PT Goals (Current goals can be found in the Care Plan section)  Acute Rehab PT Goals Patient Stated Goal: to get home, but not until tomorrow PT Goal Formulation: With patient Time For Goal Achievement: 10/14/18 Potential to Achieve Goals: Good    Frequency Min 5X/week           AM-PAC PT "6 Clicks" Mobility  Outcome Measure Help needed turning from your back to your side while in a flat bed without using bedrails?: A Little Help needed moving from lying on your back to sitting on the side of a flat bed without using bedrails?: A Little Help needed moving to and from a bed to a chair (including a wheelchair)?: A Little Help needed standing up from a chair using your arms (e.g., wheelchair or bedside chair)?: A Little Help needed to walk in hospital room?: A Little Help needed climbing 3-5 steps with a railing? : A Little 6 Click Score: 18    End of Session Equipment Utilized During Treatment: Gait belt Activity Tolerance: Patient limited by pain;Patient limited by fatigue Patient left: in chair;with call bell/phone within  reach Nurse Communication: Mobility status;Other (comment)(family to bring CAM boot from home) PT Visit Diagnosis: Muscle weakness (generalized) (M62.81);Difficulty in walking, not elsewhere classified (R26.2);Pain Pain - Right/Left: Left Pain - part of body: Leg    Time: 4696-2952 PT Time Calculation (min) (ACUTE ONLY): 55 min   Charges:      Wells Guiles B. Andreina Outten, PT, DPT  Acute Rehabilitation [phone] pager [phone]) [phone] office  @ [facility]: [phone]   PT Evaluation $PT Eval Moderate Complexity: 1 Mod PT Treatments $Therapeutic Exercise: 8-22 mins $Therapeutic Activity: 8-22 mins $Self Care/Home Management: 8-22       10/07/2018, 3:16 PM

## 2018-10-07 NOTE — Anesthesia Procedure Notes (Signed)
Procedure Name: Intubation Date/Time: 10/07/2018 7:35 AM Performed by: Alain Marion, CRNA Pre-anesthesia Checklist: Patient identified, Emergency Drugs available, Suction available and Patient being monitored Patient Re-evaluated:Patient Re-evaluated prior to induction Oxygen Delivery Method: Circle System Utilized Preoxygenation: Pre-oxygenation with 100% oxygen Induction Type: IV induction Ventilation: Mask ventilation without difficulty Laryngoscope Size: Cellucci and 2 Grade View: Grade I Tube type: Oral Tube size: 7.5 mm Number of attempts: 1 Airway Equipment and Method: Stylet and Oral airway Placement Confirmation: ETT inserted through vocal cords under direct vision,  positive ETCO2 and breath sounds checked- equal and bilateral Secured at: 22 cm Tube secured with: Tape Dental Injury: Teeth and Oropharynx as per pre-operative assessment

## 2018-10-07 NOTE — Anesthesia Postprocedure Evaluation (Signed)
Anesthesia Post Note  Patient: Dennis Dixon  Procedure(s) Performed: INTRAMEDULLARY (IM) NAIL TIBIAL (Left Leg Lower)     Patient location during evaluation: PACU Anesthesia Type: General Level of consciousness: awake and alert Pain management: pain level controlled Vital Signs Assessment: post-procedure vital signs reviewed and stable Respiratory status: spontaneous breathing, nonlabored ventilation and respiratory function stable Cardiovascular status: blood pressure returned to baseline and stable Postop Assessment: no apparent nausea or vomiting Anesthetic complications: no    Last Vitals:  Vitals:   10/07/18 1020 10/07/18 1030  BP: (!) 150/98 (!) 148/95  Pulse: 72 80  Resp: 13 12  Temp:  36.7 C  SpO2: 100% 100%    Last Pain:  Vitals:   10/07/18 1030  TempSrc:   PainSc: Asleep                 Maygen Sirico,W. EDMOND

## 2018-10-07 NOTE — Transfer of Care (Signed)
Immediate Anesthesia Transfer of Care Note  Patient: Dennis Dixon  Procedure(s) Performed: INTRAMEDULLARY (IM) NAIL TIBIAL (Left Leg Lower)  Patient Location: PACU  Anesthesia Type:General  Level of Consciousness: awake, alert  and oriented  Airway & Oxygen Therapy: Patient Spontanous Breathing and Patient connected to face mask oxygen  Post-op Assessment: Report given to RN and Post -op Vital signs reviewed and stable  Post vital signs: Reviewed and stable  Last Vitals:  Vitals Value Taken Time  BP 155/98 10/07/18 0929  Temp    Pulse 74 10/07/18 0929  Resp 14 10/07/18 0929  SpO2 99 % 10/07/18 0929  Vitals shown include unvalidated device data.  Last Pain:  Vitals:   10/07/18 0521  TempSrc: Oral  PainSc:          Complications: No apparent anesthesia complications

## 2018-10-07 NOTE — Progress Notes (Signed)
Occupational Therapy Evaluation Patient Details Name: Finian Helvey MRN: [id number] DOB: [birth year] Today's Date: 10/07/2018    History of Present Illness 23 y.o. male admitted on 10/06/18 after sustaining a L ankle tibia and figular fx (spiral and extra articular) when he was walking down an embankment and twisted his leg.  Pt underwent IM nail on 10/07/18 and is TDWB in CAM boot post op.  Pt with significant PMH of L ankle sprain.    Clinical Impression   PTA, pt was living at home with his family, and was independent with ADL/IADL and functional mobility. Pt currently requires minA for ADL and functional mobility with use of crutches. Educated pt on importance of activity progression. Anticipate pt will continue to progress toward baseline with available support at d/c and will not need additional OT services. Pt will benefit from continued acute OT services to maximize safety and independence with ALD/IADL and functional mobility. Will continue to follow acutely.     Follow Up Recommendations  No OT follow up    Equipment Recommendations  None recommended by OT    Recommendations for Other Services       Precautions / Restrictions Precautions Precautions: Fall Precaution Comments: due to TDWB status Required Braces or Orthoses: Other Brace Other Brace: CAM boot-none ordered, pt reports he has one at home and will get family to bring it up this evening.  Restrictions Weight Bearing Restrictions: Yes LLE Weight Bearing: Touchdown weight bearing      Mobility Bed Mobility Overal bed mobility: Needs Assistance Bed Mobility: Supine to Sit     Supine to sit: Min assist;HOB elevated     General bed mobility comments: up in recliner upon arrival  Transfers Overall transfer level: Needs assistance Equipment used: Crutches Transfers: Sit to/from Stand Sit to Stand: Min assist         General transfer comment: Min assist to steady him in standing    Balance Overall balance  assessment: Needs assistance Sitting-balance support: Feet supported;No upper extremity supported Sitting balance-Leahy Scale: Good     Standing balance support: Bilateral upper extremity supported Standing balance-Leahy Scale: Poor Standing balance comment: needs assist in standing and on uneven surfaces                           ADL either performed or assessed with clinical judgement   ADL Overall ADL's : Needs assistance/impaired Eating/Feeding: Set up;Sitting   Grooming: Minimal assistance;Standing   Upper Body Bathing: Set up;Sitting   Lower Body Bathing: Minimal assistance;Sit to/from stand   Upper Body Dressing : Set up;Sitting   Lower Body Dressing: Minimal assistance;Sit to/from stand   Toilet Transfer: Minimal assistance;Ambulation(crutches) Toilet Transfer Details (indicate cue type and reason): simulated from recliner to bathroom Toileting- Clothing Manipulation and Hygiene: Minimal assistance;Sit to/from stand   Tub/ Shower Transfer: Minimal assistance;Moderate assistance Tub/Shower Transfer Details (indicate cue type and reason): modA for stability  Functional mobility during ADLs: Minimal assistance;Moderate assistance(crutches) General ADL Comments: pt with minor instability with use of crutches;educated pt on activity progression and having family to assist with shower transfers initially     Vision Baseline Vision/History: No visual deficits       Perception     Praxis      Pertinent Vitals/Pain Pain Assessment: Faces Pain Score: 9  Faces Pain Scale: Hurts little more Pain Location: left lower leg Pain Descriptors / Indicators: Aching;Constant;Burning Pain Intervention(s): Limited activity within patient's tolerance;Monitored during session  Hand Dominance Right   Extremity/Trunk Assessment Upper Extremity Assessment Upper Extremity Assessment: Overall WFL for tasks assessed   Lower Extremity Assessment Lower Extremity  Assessment: LLE deficits/detail LLE Deficits / Details: left leg with normal post op pain and weakness.  able to wiggle toes, ankle trace, knee 2-/5 hip 2/5 LLE Sensation: WNL   Cervical / Trunk Assessment Cervical / Trunk Assessment: Normal   Communication Communication Communication: No difficulties   Cognition Arousal/Alertness: Awake/alert Behavior During Therapy: WFL for tasks assessed/performed Overall Cognitive Status: Within Functional Limits for tasks assessed                                     General Comments  VSS throughout session    Exercises Exercises: Total Joint Total Joint Exercises Ankle Circles/Pumps: AROM;Right;20 reps Quad Sets: AROM;Left;10 reps Towel Squeeze: AROM;Both;10 reps Heel Slides: AAROM;Left;10 reps   Shoulder Instructions      Home Living Family/patient expects to be discharged to:: Private residence Living Arrangements: Parent(mom and dad) Available Help at Discharge: Family;Available PRN/intermittently(mom and dad work) Type of Home: House Home Access: Stairs to enter Enter[employer] CorporationEntrance Stairs-Number of Steps: 9(5 steps then 4 steps) Entrance Stairs-Rails: None Home Layout: One level     Bathroom Shower/Tub: Tub/shower unit;Walk-in shower   Bathroom Toilet: Standard     Home Equipment: (has a CAM boot)          Prior Functioning/Environment Level of Independence: Independent        Comments: [employer]- works, just started        OT Problem List: Decreased strength;Decreased activity tolerance;Impaired balance (sitting and/or standing);Decreased safety awareness;Decreased knowledge of use of DME or AE      OT Treatment/Interventions:      OT Goals(Current goals can be found in the care plan section) Acute Rehab OT Goals Patient Stated Goal: to get back home OT Goal Formulation: With patient Time For Goal Achievement: 10/21/18 Potential to Achieve Goals: Good ADL Goals Pt Will Perform Grooming:  Independently Pt Will Perform Lower Body Dressing: with modified independence Pt Will Transfer to Toilet: with modified independence;ambulating Pt Will Perform Tub/Shower Transfer: with modified independence;ambulating  OT Frequency:     Barriers to D/C:            Co-evaluation              AM-PAC OT "6 Clicks" Daily Activity     Outcome Measure Help from another person eating meals?: None Help from another person taking care of personal grooming?: A Little Help from another person toileting, which includes using toliet, bedpan, or urinal?: A Little Help from another person bathing (including washing, rinsing, drying)?: A Little Help from another person to put on and taking off regular upper body clothing?: A Little Help from another person to put on and taking off regular lower body clothing?: A Little 6 Click Score: 19   End of Session Equipment Utilized During Treatment: Gait belt;Other (comment)(crutches) Nurse Communication: Mobility status  Activity Tolerance: Patient tolerated treatment well Patient left: in chair;with call bell/phone within reach  OT Visit Diagnosis: Unsteadiness on feet (R26.81);Other abnormalities of gait and mobility (R26.89);Pain Pain - Right/Left: Left Pain - part of body: Leg                Time: 1526-1600 OT Time Calculation (min): 34 min Charges:  OT General Charges $OT Visit: 1 Visit OT Evaluation $OT Eval Low  Complexity: 1 Low OT Treatments $Self Care/Home Management : 8-22 mins  Diona Brownereresa Latonyia Lopata OTR/L Acute Rehabilitation Services Office: [phone]   Rebeca Alerteresa J Ausha Sieh 10/07/2018, 4:16 PM

## 2018-10-07 NOTE — Plan of Care (Signed)

## 2018-10-07 NOTE — Progress Notes (Signed)
Updated family on patients plan of care. Family requesting Dr Lucia Gaskins to give them a call. Called Dr's office and left a message for MD to call family.

## 2018-10-07 NOTE — Op Note (Signed)
Dennis MillOctaviano Dixon male 23 y.o. 10/07/2018  PreOperative Diagnosis: Left tibia and fibula fracture  PostOperative Diagnosis: Same  PROCEDURE: Intramedullary nail fixation of left tibia  SURGEON: Dub Mikeshristopher Jakaya Jacobowitz, MD  ASSISTANT: April Green, RNFA  ANESTHESIA: General endotracheal tube  FINDINGS: Spiral left distal tibia and fibula fractures Stable ankle after reduction and fixation of the tibia  IMPLANTS: [employer] 9 x 380 mm [place] suprapatellar nail  INDICATIONS:22 y.o. male was walking down an embankment yesterday and twisted his leg.  He had immediate pain and deformity and was unable to weight-bear.  He was taken emergency department diagnosed with a distal tibia and fibula fracture.  The patient was placed in a short leg splint and orthopedics was consulted.  On my evaluation given the displacement and nature of his fracture he was indicated for operative treatment.  Patient confessed to smoking history of a few cigarettes per day but denied any history of diabetes or other medical problems.  We discussed the risk, benefits alternatives surgery which include but were not limited to wound healing complications, infection, nonunion, malunion, need for further surgery and continued pain.  We also discussed the perioperative and anesthetic risk which include death.  He understood the postoperative weightbearing restrictions and agreed to comply.  PROCEDURE: Patient was identified in the preoperative holding area.  The left leg was marked by myself.  Consent was signed myself and the patient.  He was taken the operative suite placed supine the operative table.  General endotracheal tube anesthesia was induced that difficulty.  Preoperative antibiotics were given.  A tourniquet was placed on the left thigh.  A bump was placed on the left thigh.  Bone foam was used and all bony prominences were well-padded.  We began by assessing the fracture site using fluoroscopy.  Using gentle  traction and rotation were able to line up the fracture fragments well.  Then 2 small stab incisions were made 1 antero-lateral and 1 posterior medial about the fracture site.  Then a Weber clamp was used to hold the reduction provisionally.  Then an incision was made proximal to the patellar down through skin and subcutaneous tissue.  Then the quad tendon was identified and incised longitudinally.  Then the knee joint was entered.  The entry trocar was placed under the patella down to the appropriate starting position and confirmed on fluoroscopy.  The entry wire was placed.  The opening reamer was placed over the wire to open the proximal tibia.  Then the guide pin was placed down across the fracture site center center at the distal tibia.  Appropriate position was confirmed on fluoroscopy.  Then measuring device was used and appropriate length was selected.  Then sequentially reamed to 10.5 mm with intent to place a 9 mm diameter nail.  Then the nail was placed without difficulty.  The fracture was well reduced after the nail was placed.  Then 2 proximal interlocks were placed in a static position.  This was using the guide and through small 1 cm incisions.  Then distally 3 distal interlocking screws were placed.  Care was taken on the AP screw to ensure no damage to the neurovascular bundle and therefore a blunt dissection was used after a longitudinal skin incision down to bone to protect the neurovascular bundle.  After placement of all the interlocking screws the clamp was removed.  The fracture was well reduced and acceptably aligned on AP and lateral fluoroscopy.  Then AP fluoroscopy of the ankle was obtained and stress  was placed across the syndesmosis and it was found to be stable.  Then all the wounds including the knee were irrigated copiously with normal saline.  The quad tendon was closed with a 2-0 PDS sutures.  The subcuticular tissue of all the wounds was closed with a 3-0 Monocryl and the skin  with staples.  He was placed in soft dressing of Xeroform, 4 x 4's, sterile sheet cotton and 6 inch Ace wrap.  He will be placed in a walking boot for ambulation.  Counts are correct at the end the case there were no complications.  He tolerated procedure well.  He was awakened from anesthesia and taken to recovery in stable condition.  POST OPERATIVE INSTRUCTIONS: Touchdown weightbearing to left lower extremity Keep dressing in place until follow-up He will wear a boot when mobilizing He will work with physical therapy for disposition He will be discharged on 2 weeks of Lovenox for DVT prophylaxis He will finished perioperative antibiotics while in house.  TOURNIQUET TIME: No tourniquet was used  BLOOD LOSS:  less than 100 mL         DRAINS: none         SPECIMEN: none       COMPLICATIONS:  * No complications entered in OR log *         Disposition: PACU - hemodynamically stable.         Condition: stable

## 2018-10-08 ENCOUNTER — Encounter (HOSPITAL_COMMUNITY): Payer: Self-pay | Admitting: Orthopaedic Surgery

## 2018-10-08 MED ORDER — ENOXAPARIN SODIUM 40 MG/0.4ML ~~LOC~~ SOLN: 40.0000 mg | SUBCUTANEOUS | 0 refills | Status: AC

## 2018-10-08 MED ORDER — OXYCODONE HCL 5 MG PO TABS: 5.0000 mg | ORAL_TABLET | ORAL | 0 refills | Status: AC | PRN

## 2018-10-08 MED ORDER — METHOCARBAMOL 500 MG PO TABS: 500.0000 mg | ORAL_TABLET | Freq: Four times a day (QID) | ORAL | 0 refills | Status: AC | PRN

## 2018-10-08 MED ORDER — NAPROXEN 250 MG PO TABS: 250.0000 mg | ORAL_TABLET | Freq: Two times a day (BID) | ORAL | 0 refills | Status: AC

## 2018-10-08 NOTE — TOC Initial Note (Signed)
Transition of Care [facility]) - Initial/Assessment Note    Patient Details  Name: Dennis Dixon MRN: [id number] Date of Birth: 1995-10-11  Transition of Care [facility] [address]) CM/SW Contact:    Alberteen Sam, [place] Phone Number: [phone] 10/08/2018, 2:17 PM  Clinical Narrative:                  Patient set up with [facility] PT through [facility], and wheel chair being delivered to room by [employer]. Patient prepared for discharge today, mother Lelan Pons updated. No further needs identified.   Expected Discharge Plan: [place] Barriers to Discharge: No Barriers Identified   Patient Goals and CMS Choice   CMS Medicare.gov Compare Post Acute Care list provided to:: Patient Represenative (must comment)(Marie (mother)) Choice offered to / list presented to : Parent  Expected Discharge Plan and Services Expected Discharge Plan: [place] Acute Care Choice: [place] arrangements for the past 2 months: Single Family Home Expected Discharge Date: 10/08/18               DME Arranged: Youth worker wheelchair with seat cushion DME Agency: [employer] Date DME Agency Contacted: 10/08/18 Time DME Agency Contacted: 2 Representative spoke with at DME Agency: [place]: PT [place]: [place] Date [place]: 10/08/18 Time [place]: 2 Representative spoke with at [place]: Hoyle Sauer  Prior Living Arrangements/Services Living arrangements for the past 2 months: [place] Lives with:: Parents Patient language and need for interpreter reviewed:: Yes        Need for Family Participation in Patient Care: Yes (Comment) Care giver support system in place?: Yes (comment)   Criminal Activity/Legal Involvement Pertinent to Current Situation/Hospitalization: No - Comment as needed  Activities of Daily Living Home Assistive Devices/Equipment: None ADL Screening (condition at time of admission) Patient's  cognitive ability adequate to safely complete daily activities?: Yes Is the patient deaf or have difficulty hearing?: No Does the patient have difficulty seeing, even when wearing glasses/contacts?: No Does the patient have difficulty concentrating, remembering, or making decisions?: No Patient able to express need for assistance with ADLs?: Yes Does the patient have difficulty dressing or bathing?: Yes Independently performs ADLs?: No Communication: Independent Does the patient have difficulty walking or climbing stairs?: Yes Weakness of Legs: Left Weakness of Arms/Hands: None  Permission Sought/Granted Permission sought to share information with : Case Manager, Family Supports, Chartered certified accountant granted to share information with : Yes, Verbal Permission Granted  Share Information with NAME: Lelan Pons  Permission granted to share info w AGENCY: HH  Permission granted to share info w Relationship: mother  Permission granted to share info w Contact Information: [phone]  Emotional Assessment Appearance:: Appears stated age Attitude/Demeanor/Rapport: Gracious Affect (typically observed): Calm Orientation: : Oriented to Self, Oriented to Place, Oriented to  Time, Oriented to Situation Alcohol / Substance Use: Not Applicable Psych Involvement: No (comment)  Admission diagnosis:  Closed fracture of left tibia and fibula, initial encounter [S82.202A, S82.402A] Patient Active Problem List   Diagnosis Date Noted  . Left tibial fracture 10/06/2018   PCP:  Patient, No Pcp Per Pharmacy:  No Pharmacies Listed    Social Determinants of Health (SDOH) Interventions    Readmission Risk Interventions No flowsheet data found.

## 2018-10-08 NOTE — Discharge Summary (Signed)
Patient ID: Dennis Dixon MRN: [id number] DOB/AGE: [birth year] 23 y.o.  Admit date: 10/06/2018 Discharge date: 10/08/2018  Admission Diagnoses:  Active Problems:   Left tibial fracture   Discharge Diagnoses:  Same  History reviewed. No pertinent past medical history.  Surgeries: Procedure(s): INTRAMEDULLARY (IM) NAIL TIBIAL on 10/07/2018   Consultants:   Discharged Condition: Improved  Hospital Course: Dennis Dixon is an 23 y.o. male who was admitted 10/06/2018 for operative treatment of<principal problem not specified>. Patient has severe unremitting pain that affects sleep, daily activities, and work/hobbies. After pre-op clearance the patient was taken to the operating room on 10/07/2018 and underwent  Procedure(s): INTRAMEDULLARY (IM) NAIL TIBIAL.    Postoperative day 1 patient was evaluated.  He is resting comfortably.  Complained of some pain in the left leg but overall was doing well.  He was able to mobilize with physical therapy the day prior with minimal assist.  Denies any shortness of breath or chest pain.  Exam: Awake and alert Respirations even and unlabored  Left lower extremity with dressing in place.  No drainage.  Calf is soft.  Distally patient able to dorsiflex and plantarflex the ankle.  Endorses sensation in superficial and deep peroneal nerve distributions as well as the plantar foot.  Palpable dorsalis pedis pulse.  Plan: Patient will be discharged home today. He will follow-up with me in 2 weeks for x-rays and wound check. He will maintain touchdown weightbearing He will take Lovenox for 2 weeks for DVT prophylaxis He will call the office any concerns.   Patient was given perioperative antibiotics:  Anti-infectives (From admission, onward)   Start     Dose/Rate Route Frequency Ordered Stop   10/07/18 1100  ceFAZolin (ANCEF) IVPB 2g/100 mL premix     2 g 200 mL/hr over 30 Minutes Intravenous Every 6 hours 10/07/18 1049 10/07/18 2359   10/07/18 0600   ceFAZolin (ANCEF) IVPB 2g/100 mL premix     2 g 200 mL/hr over 30 Minutes Intravenous On call to O.R. 10/06/18 1831 10/07/18 7517       Patient was given sequential compression devices, early ambulation, and chemoprophylaxis to prevent DVT.  Patient benefited maximally from hospital stay and there were no complications.    Recent vital signs:  Patient Vitals for the past 24 hrs:  BP Temp Temp src Pulse Resp SpO2  10/08/18 0538 (!) 141/91 99.3 F (37.4 C) Oral 68 18 100 %  10/08/18 0033 (!) 151/97 97.7 F (36.5 C) Oral 68 19 100 %  10/07/18 1935 (!) 151/92 98.4 F (36.9 C) Oral 88 16 98 %  10/07/18 1424 (!) 158/94 98.2 F (36.8 C) Oral 96 16 96 %  10/07/18 1109 (!) 150/105 98.6 F (37 C) Oral 76 17 100 %  10/07/18 1030 (!) 148/95 98 F (36.7 C) - 80 12 100 %  10/07/18 1020 (!) 150/98 - - 72 13 100 %  10/07/18 1015 (!) 148/102 - - 73 13 100 %  10/07/18 1006 (!) 146/102 - - - - -  10/07/18 0930 - 98.5 F (36.9 C) - - - -     Recent laboratory studies:  Recent Labs    10/06/18 1053 10/07/18 1101  WBC 9.5 11.7*  HGB 15.5 14.4  HCT 43.2 40.2  PLT 256 208  NA 139  --   K 4.2  --   CL 102  --   CO2 23  --   BUN 14  --   CREATININE 0.98 1.10  GLUCOSE 109*  --  CALCIUM 9.3  --      Discharge Medications:   Allergies as of 10/08/2018      Reactions   Amoxicillin Itching      Medication List    TAKE these medications   enoxaparin 40 MG/0.4ML injection Commonly known as: LOVENOX Inject 0.4 mLs (40 mg total) into the skin daily for 14 days.   methocarbamol 500 MG tablet Commonly known as: ROBAXIN Take 1 tablet (500 mg total) by mouth every 6 (six) hours as needed for muscle spasms.   naproxen 250 MG tablet Commonly known as: NAPROSYN Take 1 tablet (250 mg total) by mouth 2 (two) times daily with a meal.   oxyCODONE 5 MG immediate release tablet Commonly known as: Oxy IR/ROXICODONE Take 1-2 tablets (5-10 mg total) by mouth every 4 (four) hours as needed for  up to 7 days for moderate pain (pain score 4-6).       Diagnostic Studies: Dg Tibia/fibula Left  Result Date: 10/07/2018 CLINICAL DATA:  IM left tibial nail EXAM: DG C-ARM 61-120 MIN; LEFT TIBIA AND FIBULA - 2 VIEW FLUOROSCOPY TIME:  2 minutes, 30 seconds COMPARISON:  Left ankle and tib-fib radiographs-10/06/2018 FINDINGS: 8 spot intraoperative fluoroscopic images of the left tibia and fibula are provided for review. Images demonstrate the sequela of intramedullary rod fixation transfixing known obliquely oriented distal tibial fracture. The distal end of the tibial rod is transfixed with 3 cancellous screws while the proximal and this transfixed with 2 cancellous screws. Alignment appears near anatomic. Improved alignment of known comminuted left fibular fracture without dedicated ORIF. Expected adjacent subcutaneous emphysema. No radiopaque foreign body. IMPRESSION: Post left tibial intramedullary rod fixation without evidence of complication. Electronically Signed   By: Simonne ComeJohn  Watts M.D.   On: 10/07/2018 09:38   Dg Tibia/fibula Left  Result Date: 10/06/2018 CLINICAL DATA:  Fall with left lower extremity pain EXAM: LEFT TIBIA AND FIBULA - 2 VIEW COMPARISON:  None. FINDINGS: Comminuted non articular oblique left distal tibial metadiaphysis fracture with 1 cm over riding and 9 mm posterior/lateral displacement of the dominant distal fracture fragment. Comminuted oblique left distal fibula fracture extending into the lateral malleolus with 5 mm posterior displacement of the dominant distal fracture fragment and 1 cm overriding. No additional fractures. No dislocation at the knee or ankle. No suspicious focal osseous lesions. No radiopaque foreign body. Soft tissue swelling surrounding the fracture sites. IMPRESSION: Comminuted left distal tibial and left distal fibula fractures as detailed. Electronically Signed   By: Delbert PhenixJason A Poff M.D.   On: 10/06/2018 10:22   Dg Ankle Complete Left  Result Date:  10/06/2018 CLINICAL DATA:  Fall today with left ankle pain EXAM: LEFT ANKLE COMPLETE - 3+ VIEW COMPARISON:  None. FINDINGS: Comminuted non articular spiral fracture of the distal metadiaphysis of the tibia with 1 cm over riding, 8 mm posterior and lateral displacement of the dominant distal fracture fragment and slight apex lateral angulation. Comminuted oblique left distal fibula fracture extending into lateral malleolus with 4 mm posterior displacement of the dominant distal fracture fragment and 1 cm overriding. No subluxation at the left ankle joint. No suspicious focal osseous lesions. Soft tissue swelling surrounding the fracture sites. No radiopaque foreign body. IMPRESSION: 1. Comminuted spiral fracture of the distal left tibial metadiaphysis as detailed. 2. Comminuted oblique left distal fibula fracture as detailed. Electronically Signed   By: Delbert PhenixJason A Poff M.D.   On: 10/06/2018 10:19   Dg C-arm 1-60 Min  Result Date: 10/07/2018 CLINICAL DATA:  IM  left tibial nail EXAM: DG C-ARM 61-120 MIN; LEFT TIBIA AND FIBULA - 2 VIEW FLUOROSCOPY TIME:  2 minutes, 30 seconds COMPARISON:  Left ankle and tib-fib radiographs-10/06/2018 FINDINGS: 8 spot intraoperative fluoroscopic images of the left tibia and fibula are provided for review. Images demonstrate the sequela of intramedullary rod fixation transfixing known obliquely oriented distal tibial fracture. The distal end of the tibial rod is transfixed with 3 cancellous screws while the proximal and this transfixed with 2 cancellous screws. Alignment appears near anatomic. Improved alignment of known comminuted left fibular fracture without dedicated ORIF. Expected adjacent subcutaneous emphysema. No radiopaque foreign body. IMPRESSION: Post left tibial intramedullary rod fixation without evidence of complication. Electronically Signed   By: Simonne ComeJohn  Watts M.D.   On: 10/07/2018 09:38    Disposition: Discharge disposition: 01-Home or Self Care       Discharge  Instructions    Call MD / Call 911   Complete by: As directed    If you experience chest pain or shortness of breath, CALL 911 and be transported to the hospital emergency room.  If you develope a fever above 101 F, pus (white drainage) or increased drainage or redness at the wound, or calf pain, call your surgeon's office.   Constipation Prevention   Complete by: As directed    Drink plenty of fluids.  Prune juice may be helpful.  You may use a stool softener, such as Colace (over the counter) 100 mg twice a day.  Use MiraLax (over the counter) for constipation as needed.   Diet - low sodium heart healthy   Complete by: As directed    Increase activity slowly as tolerated   Complete by: As directed    TDWB LLE         Signed: Terance Harthristopher R Ras Kollman 10/08/2018, 8:53 AM

## 2018-10-08 NOTE — Progress Notes (Signed)
Occupational Therapy Treatment Patient Details Name: Dennis Dixon MRN: [id number] DOB: 31-Aug-1995 Today's Date: 10/08/2018    History of present illness 23 y.o. male admitted on 10/06/18 after sustaining a L ankle tibia and figular fx (spiral and extra articular) when he was walking down an embankment and twisted his leg.  Pt underwent IM nail on 10/07/18 and is TDWB in CAM boot post op.  Pt with significant PMH of L ankle sprain.    OT comments  Pt progressing toward established goals. Pt currently requires minA for LB dressing, setupA for upper body dressing and minA for functional mobility with use of crutches. Pt requires minA for simulated shower transfer, educated pt to have physical assistance with shower transfer. Pt demonstrates difficulty with safe and appropriate use of crutches, required minA to correct minor LOB when transferring crutches prior to sitting in recliner. Per nsg, pt to d/c this date. Will continue to follow should pt remain in acute care.     Follow Up Recommendations  No OT follow up    Equipment Recommendations  None recommended by OT    Recommendations for Other Services      Precautions / Restrictions Precautions Precautions: Fall Precaution Comments: due to TDWB status Required Braces or Orthoses: Other Brace Other Brace: CAM boot-none ordered, pt reports he has one at home and will get family to bring it up this evening.  Restrictions Weight Bearing Restrictions: Yes LLE Weight Bearing: Touchdown weight bearing       Mobility Bed Mobility Overal bed mobility: Needs Assistance             General bed mobility comments: pt sitting EOB upon arrival  Transfers Overall transfer level: Needs assistance Equipment used: Crutches Transfers: Sit to/from Stand Sit to Stand: Min assist;Mod assist         General transfer comment: min assistance to come to standing, requires mod cueing for sequencing of crutches     Balance Overall balance  assessment: Needs assistance Sitting-balance support: Feet supported;No upper extremity supported Sitting balance-Leahy Scale: Good     Standing balance support: Bilateral upper extremity supported Standing balance-Leahy Scale: Poor Standing balance comment: minor LOB posterior x1 required min assistance to correct                           ADL either performed or assessed with clinical judgement   ADL Overall ADL's : Needs assistance/impaired                 Upper Body Dressing : Set up;Sitting Upper Body Dressing Details (indicate cue type and reason): pt donned shirt while sitting EOB; Lower Body Dressing: Minimal assistance;Sit to/from stand Lower Body Dressing Details (indicate cue type and reason): minA for sequencing to don LB clothing over LLE first Toilet Transfer: Minimal assistance;Ambulation(crutches) Toilet Transfer Details (indicate cue type and reason): simulated in room Toileting- Clothing Manipulation and Hygiene: Minimal assistance;Sit to/from stand   Tub/ Shower Transfer: Minimal assistance;Moderate assistance   Functional mobility during ADLs: Minimal assistance;Moderate assistance(crutches) General ADL Comments: pt with minor instability with use of crutches;educated pt on activity progression and having family to assist with shower transfers initially     Vision       Perception     Praxis      Cognition Arousal/Alertness: Awake/alert Behavior During Therapy: WFL for tasks assessed/performed Overall Cognitive Status: Within Functional Limits for tasks assessed  Exercises     Shoulder Instructions       General Comments pt expressed concern with taking pain medications;discussed opiod addiction with pt and discussed exploring healthy coping strategies with pt;pt expressed appreciation and plans to dig deeper to uncover healthy coping strategies    Pertinent Vitals/  Pain       Pain Assessment: 0-10 Pain Score: 7  Pain Location: left lower leg Pain Descriptors / Indicators: Aching;Constant;Burning Pain Intervention(s): Limited activity within patient's tolerance;Monitored during session;Premedicated before session  Home Living                                          Prior Functioning/Environment              Frequency  Min 2X/week        Progress Toward Goals  OT Goals(current goals can now be found in the care plan section)  Progress towards OT goals: Progressing toward goals  Acute Rehab OT Goals Patient Stated Goal: to get back home OT Goal Formulation: With patient Time For Goal Achievement: 10/21/18 Potential to Achieve Goals: Good ADL Goals Pt Will Perform Grooming: Independently Pt Will Perform Lower Body Dressing: with modified independence Pt Will Transfer to Toilet: with modified independence;ambulating Pt Will Perform Tub/Shower Transfer: with modified independence;ambulating  Plan Discharge plan remains appropriate    Co-evaluation                 AM-PAC OT "6 Clicks" Daily Activity     Outcome Measure   Help from another person eating meals?: None Help from another person taking care of personal grooming?: A Little Help from another person toileting, which includes using toliet, bedpan, or urinal?: A Little Help from another person bathing (including washing, rinsing, drying)?: A Little Help from another person to put on and taking off regular upper body clothing?: A Little Help from another person to put on and taking off regular lower body clothing?: A Little 6 Click Score: 19    End of Session Equipment Utilized During Treatment: Gait belt;Other (comment)(crutches)  OT Visit Diagnosis: Unsteadiness on feet (R26.81);Other abnormalities of gait and mobility (R26.89);Pain Pain - Right/Left: Left Pain - part of body: Leg   Activity Tolerance Patient tolerated treatment well    Patient Left in chair;with call bell/phone within reach(with PT present)   Nurse Communication Mobility status        Time: [id number] OT Time Calculation (min): 42 min  Charges: OT General Charges $OT Visit: 1 Visit OT Treatments $Self Care/Home Management : 38-52 mins  Diona Brownereresa Artis Beggs OTR/L Acute Rehabilitation Services Office: [phone]    Rebeca Alerteresa J Dalyah Pla 10/08/2018, 12:25 PM

## 2018-10-08 NOTE — Progress Notes (Signed)
Physical Therapy Treatment Patient Details Name: Dennis Dixon MRN: [id number] DOB: 10/08/1995 Today's Date: 10/08/2018    History of Present Illness 23 y.o. male admitted on 10/06/18 after sustaining a L ankle tibia and figular fx (spiral and extra articular) when he was walking down an embankment and twisted his leg.  Pt underwent IM nail on 10/07/18 and is TDWB in CAM boot post op.  Pt with significant PMH of L ankle sprain.     PT Comments    Pt presents with poor safety and balance during session. He had multiple LOB x2 backing to stairs for stair training and when sequencing crutches to return to seated position.  Pt will require HHPT to improve balance and promote independence in his home setting.  Based on his current level of function will recommend a light weight WC for home use to negotiate home until his balance improves with PT in the home setting.  Issued a gait belt for home use as well.  Will inform supervising PT of need for WC at home to improve safety as he recovers.     Follow Up Recommendations  Home health PT;Supervision/Assistance - 24 hour     Equipment Recommendations  Crutches;Wheelchair (measurements PT);Wheelchair cushion (measurements PT)(18x18 WC)    Recommendations for Other Services       Precautions / Restrictions Precautions Precautions: Fall Precaution Comments: due to TDWB status Other Brace: CAM boot-none ordered, pt reports he has one at home and will get family to bring it up this evening.  Restrictions Weight Bearing Restrictions: Yes LLE Weight Bearing: Touchdown weight bearing    Mobility  Bed Mobility               General bed mobility comments: Pt in recliner on arrival.  Transfers Overall transfer level: Needs assistance Equipment used: Crutches Transfers: Sit to/from Stand Sit to Stand: Min assist;Mod assist         General transfer comment: min assistance to come to standing, presents with strong LOB posterior attempting to  sit  in recliner.  Required moderate assistance to lower patient back to seated surface.  Pt has difficulty sequencing crutches and maintaining balance.  Ambulation/Gait Ambulation/Gait assistance: Min assist;Mod assist Gait Distance (Feet): 100 Feet Assistive device: Crutches Gait Pattern/deviations: Step-to pattern(hop to pattern)     General Gait Details: Pt requires max VCs to utilize LLE for balance.  He presents with more of NWB status.  During backing to attempt stair training he presents with LOB posterior   Stairs Stairs: Yes Stairs assistance: Min assist Stair Management: No rails;Backwards;Forwards;With crutches Number of Stairs: 6 General stair comments: Cues for sequencing and crutch placement.  Pt required max Vcs for safety.   Wheelchair Mobility    Modified Rankin (Stroke Patients Only)       Balance Overall balance assessment: Needs assistance Sitting-balance support: Feet supported;No upper extremity supported Sitting balance-Leahy Scale: Good     Standing balance support: Bilateral upper extremity supported Standing balance-Leahy Scale: Poor Standing balance comment: LOB posterior x2 required moderate assistance to correct,.                            Cognition Arousal/Alertness: Awake/alert Behavior During Therapy: WFL for tasks assessed/performed Overall Cognitive Status: Within Functional Limits for tasks assessed  Exercises      General Comments        Pertinent Vitals/Pain Pain Assessment: 0-10 Pain Score: 5  Pain Location: left lower leg Pain Descriptors / Indicators: Aching;Constant;Burning Pain Intervention(s): Monitored during session;Repositioned    Home Living                      Prior Function            PT Goals (current goals can now be found in the care plan section) Acute Rehab PT Goals Patient Stated Goal: to get back home PT Goal  Formulation: With patient Potential to Achieve Goals: Good    Frequency    Min 5X/week      PT Plan Current plan remains appropriate    Co-evaluation              AM-PAC PT "6 Clicks" Mobility   Outcome Measure  Help needed turning from your back to your side while in a flat bed without using bedrails?: A Little Help needed moving from lying on your back to sitting on the side of a flat bed without using bedrails?: A Little Help needed moving to and from a bed to a chair (including a wheelchair)?: A Lot Help needed standing up from a chair using your arms (e.g., wheelchair or bedside chair)?: A Lot Help needed to walk in hospital room?: A Lot Help needed climbing 3-5 steps with a railing? : A Little 6 Click Score: 15    End of Session Equipment Utilized During Treatment: Gait belt(issued gt belt for home use with family due to multiple LOBs.) Activity Tolerance: Patient limited by pain;Patient limited by fatigue Patient left: in chair;with call bell/phone within reach Nurse Communication: Mobility status;Other (comment) PT Visit Diagnosis: Muscle weakness (generalized) (M62.81);Difficulty in walking, not elsewhere classified (R26.2);Pain Pain - Right/Left: Left Pain - part of body: Leg     Time: [id number] PT Time Calculation (min) (ACUTE ONLY): 23 min  Charges:  $Gait Training: 8-22 mins $Therapeutic Activity: 8-22 mins                     Dennis Dixon, PTA Acute Rehabilitation Services Pager [phone] Office [phone]     Dennis Dixon 10/08/2018, 11:30 AM

## 2018-10-08 NOTE — Progress Notes (Addendum)
Patient suffers from left tibial fracture which impairs their ability to perform daily activities like ambulating in the home. Crutches or a walker will not resolve issue with performing activities of daily living. A wheelchair will allow patient to safely perform daily activities. Patient is not able to propel themselves in the home using a standard weight wheelchair due to weakness, NWB to left leg.. Patient can self propel in the lightweight wheelchair. Length of need up to 6 months Accessories: elevating leg rests (ELRs), wheel locks, extensions and anti-tippers.   Agree with above.

## 2018-10-08 NOTE — Progress Notes (Signed)
CSW followed up with Dr. Lucia Gaskins regarding discharge planning, as CSW notes DC summary reports home with self care, while PT recommended HH PT . CSW spoke with assistant Ebony Hail who reports patient will require no needs at discharge.   CSW has informed nurse.   [place], [place]

## 2018-10-08 NOTE — Progress Notes (Signed)
Patient suffers from L IM nail to tibia and is TDWB which impairs their ability to perform daily activities like increased ambulation in the home.  A pair of crutches  alone will not resolve the issues with performing activities of daily living. A light weight wheelchair will allow patient to safely perform daily activities.  The patient can self propel in the home or has a caregiver who can provide assistance.   ;  Governor Rooks, PTA [place] Pager [phone] Office [phone]

## 2018-10-08 NOTE — Discharge Instructions (Signed)
Intramedullary Nailing of Tibial Diaphyseal Fracture, Care After This sheet gives you information about how to care for yourself after your procedure. Your health care provider may also give you more specific instructions. If you have problems or questions, contact your health care provider. What can I expect after the procedure? After the procedure, it is common to have:  Pain and swelling in your lower leg. Follow these instructions at home: Medicines  Take over-the-counter and prescription medicines only as told by your health care provider.  If you were prescribed an antibiotic medicine, take it as told by your health care provider. Do not stop taking the antibiotic even if you start to feel better.  Do not drive or use heavy machinery while taking prescription medicine or until your health care provider approves. If you have a splint:  Wear the splint as told by your health care provider. Remove it only as told by your health care provider.  Loosen the splint if your toes tingle, become numb, or turn cold and blue.  Keep the splint clean.  If the splint is not waterproof: ? Do not let it get wet. ? Cover it with a watertight covering when you take a shower. Bathing  Do not take baths, swim, or use a hot tub until your health care provider approves. You may only be allowed to take sponge baths for bathing for the first few days.  Ask your health care provider if you can take showers.  Keep your bandage (dressing) dry until your health care provider says it can be removed. Incision care   Follow instructions from your health care provider about how to take care of your incision. Make sure you: ? Wash your hands with soap and water before you change your bandage (dressing). If soap and water are not available, use hand sanitizer. ? Change your dressing as told by your health care provider. ? Leave stitches (sutures), skin glue, or adhesive strips in place. These skin closures  may need to stay in place for 2 weeks or longer. If adhesive strip edges start to loosen and curl up, you may trim the loose edges. Do not remove adhesive strips completely unless your health care provider tells you to do that.  Check your incision area every day for signs of infection. Check for: ? More redness, swelling, or pain. ? More fluid or blood. ? Warmth. ? Pus or a bad smell. Managing pain, stiffness, and swelling   Raise (elevate) the injured area above the level of your heart while you are sitting or lying down.  Move your toes often to avoid stiffness and to lessen swelling.  If directed, put ice on the injured area. ? Put ice in a plastic bag. ? Place a towel between your skin and the bag. ? Leave the ice on for 20 minutes, 2-3 times a day. Activity  Do not use the injured limb to support your body weight. You may be able to put some weight on your leg (partial weight-bearing). Use crutches or a walker. Follow directions as told by your healthcare provider.  Avoid sitting or lying for a long time without moving. You will be encouraged to walk with assistance as soon as you can. This will help prevent blood clots.  If physical therapy was prescribed, do exercises as directed. It is important to do physical therapy to regain muscle strength and range of motion in your leg.  Rest and avoid activities that require a lot of effort or  put you at risk for a fall or another leg injury. Ask your health care provider what activities are safe for you and when you can begin leg motion after surgery. General instructions  To prevent or treat constipation while you are taking prescription pain medicine, your health care provider may recommend that you: ? Drink enough fluid to keep your urine clear or pale yellow. ? Take over-the-counter or prescription medicines. ? Eat foods that are high in fiber, such as fresh fruits and vegetables, whole grains, and beans. ? Limit foods that are  high in fat and processed sugars, such as fried and sweet foods.  Do not use any products that contain nicotine or tobacco, such as cigarettes and e-cigarettes. These can delay bone healing. If you need help quitting, ask your health care provider.  Take steps to prevent falls at home, such as removing throw rugs and tripping hazards.  Keep all follow-up visits as told by your health care provider. This is important. Contact a health care provider if:  You have more redness, swelling, or pain around your incision.  You have more fluid or blood coming from your incision.  Your incision feels warm to the touch.  You have pus or a bad smell coming from your incision.  You have a fever. Get help right away if:  Your incision breaks open.  You have red, painful, or swollen areas in one or both legs.  You have severe pain that does not get better with medicine. Summary  After your procedure, it is common to have pain and swelling in your lower leg.  Check your incision area every day for signs of infection, such as more redness or swelling.  Do not use the injured limb to support your body weight. You may be able to put some weight on your leg (partial weight-bearing). Use crutches or a walker. Follow directions as told by your healthcare provider.  Ask your health care provider what activities are safe for you while you recover. It is important to do physical therapy as instructed to regain strength and range of motion in your leg.  Keep all follow-up visits as told by your health care provider. This is important. This information is not intended to replace advice given to you by your health care provider. Make sure you discuss any questions you have with your health care provider. Document Released: 03/25/2013 Document Revised: 03/25/2018 Document Reviewed: 05/24/2016 Elsevier Patient Education  2020 [employer].

## 2020-02-22 IMAGING — CR LEFT ANKLE COMPLETE - 3+ VIEW
3 series · 3 of 3 positions shown · non-contrast
Comparison: None.

CLINICAL DATA: Fall today with left ankle pain

EXAM:
LEFT ANKLE COMPLETE - 3+ VIEW

[x ankle ap left]
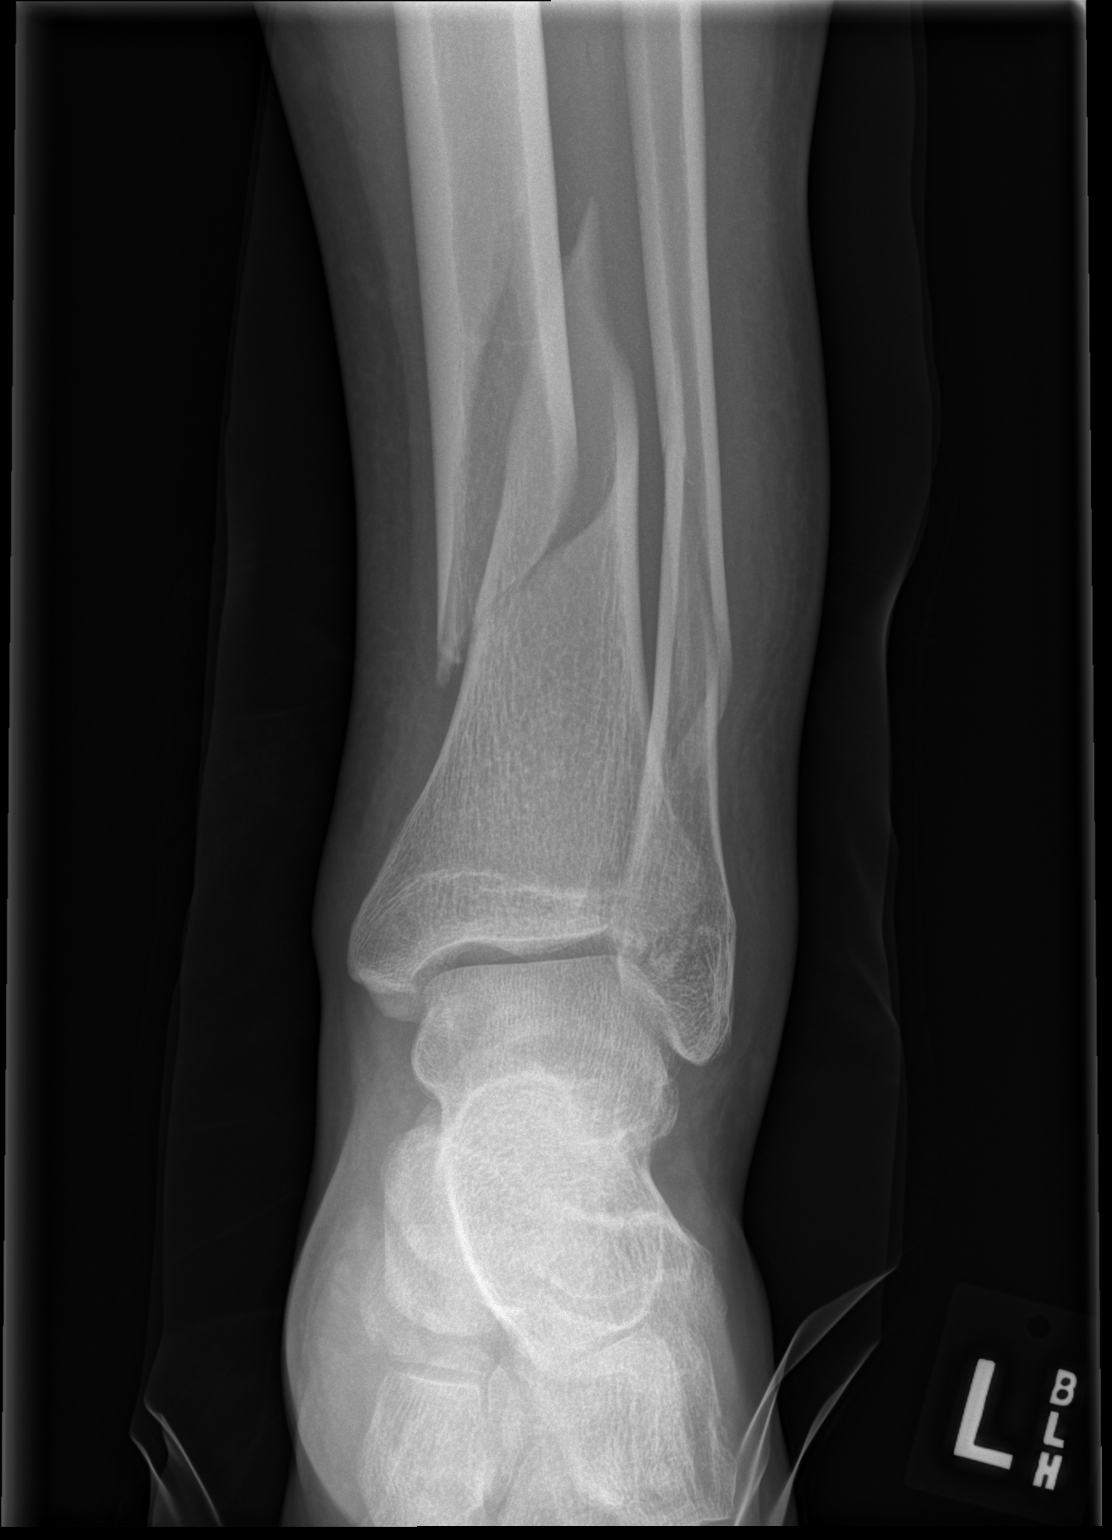

[x ankle obl left]
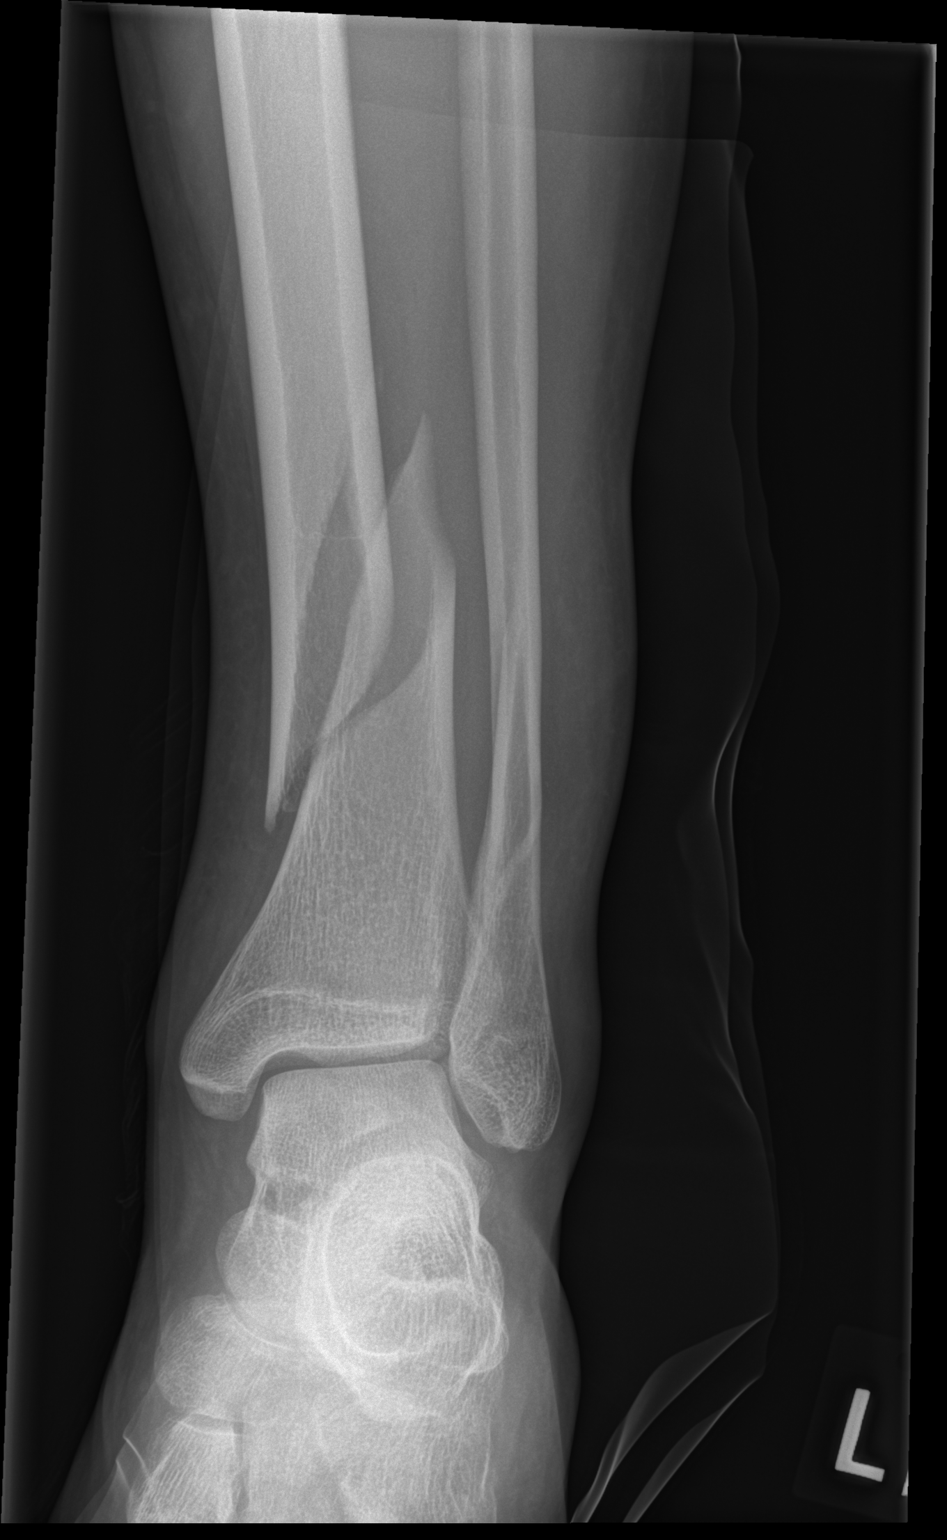

[x ankle lat left]
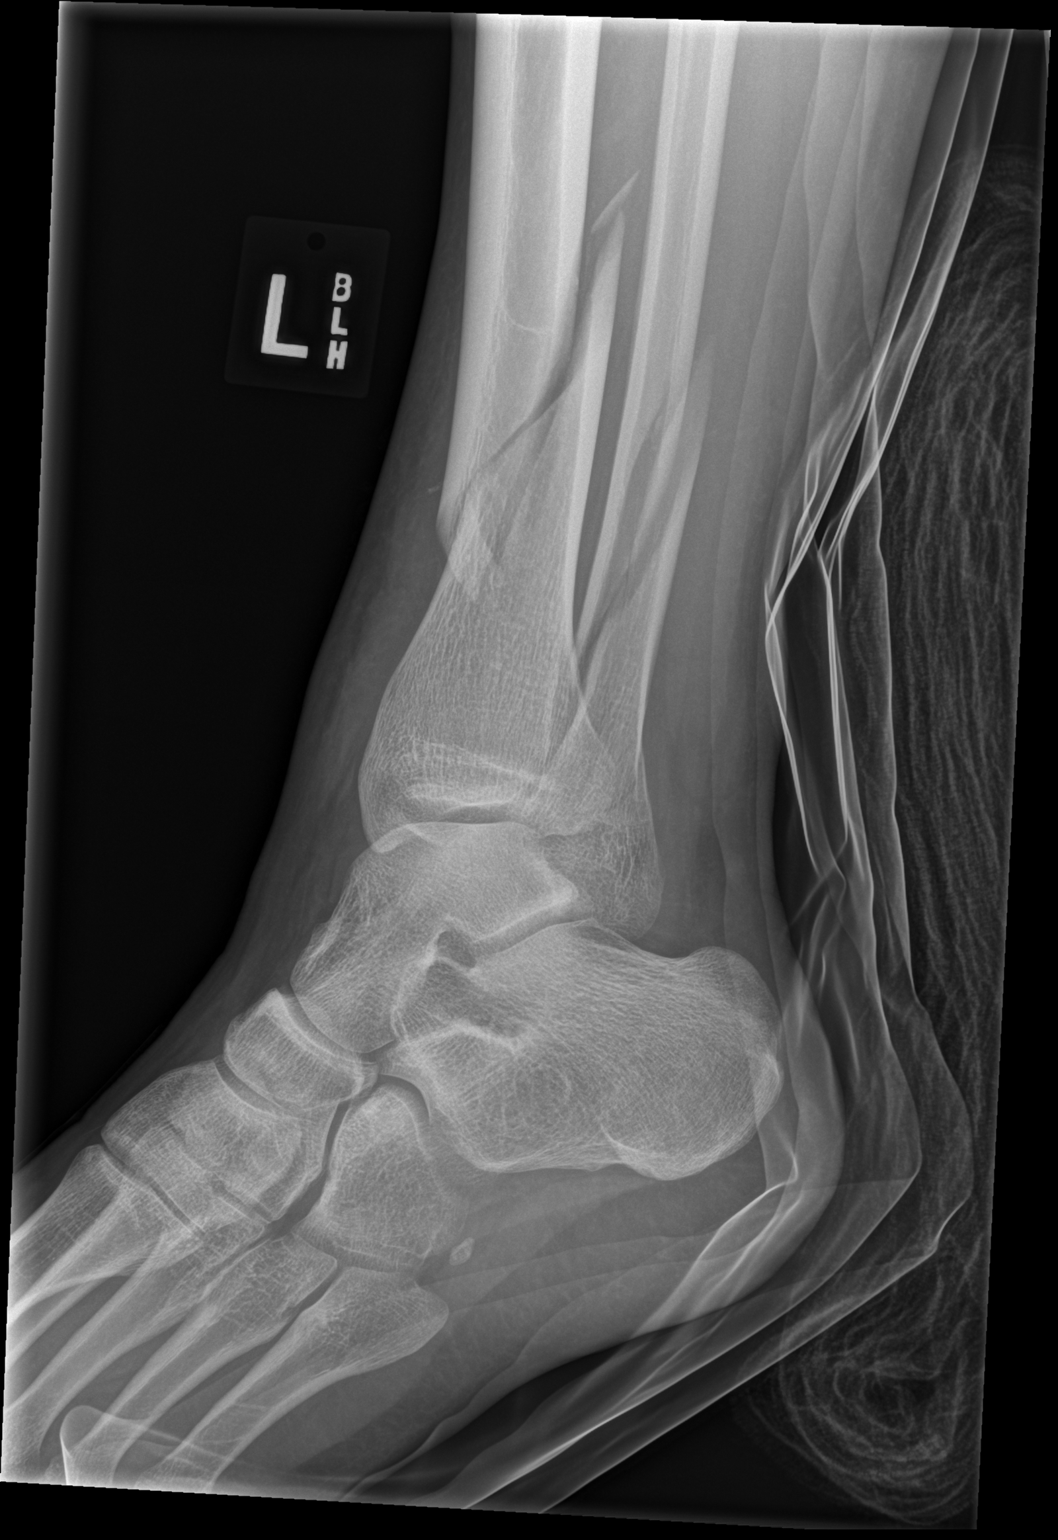

[3 of 3 positions shown; findings below may reference images not displayed]

FINDINGS: Comminuted non articular spiral fracture of the distal metadiaphysis
of the tibia with 1 cm over riding, 8 mm posterior and lateral
displacement of the dominant distal fracture fragment and slight
apex lateral angulation. Comminuted oblique left distal fibula
fracture extending into lateral malleolus with 4 mm posterior
displacement of the dominant distal fracture fragment and 1 cm
overriding. No subluxation at the left ankle joint. No suspicious
focal osseous lesions. Soft tissue swelling surrounding the fracture
sites. No radiopaque foreign body.
IMPRESSION: 1. Comminuted spiral fracture of the distal left tibial
metadiaphysis as detailed.
2. Comminuted oblique left distal fibula fracture as detailed.

## 2020-02-22 IMAGING — CR LEFT TIBIA AND FIBULA - 2 VIEW
4 series · 4 of 4 positions shown · non-contrast
Comparison: None.

CLINICAL DATA: Fall with left lower extremity pain

EXAM:
LEFT TIBIA AND FIBULA - 2 VIEW

[x tib-fib ap left (1 of 2)]
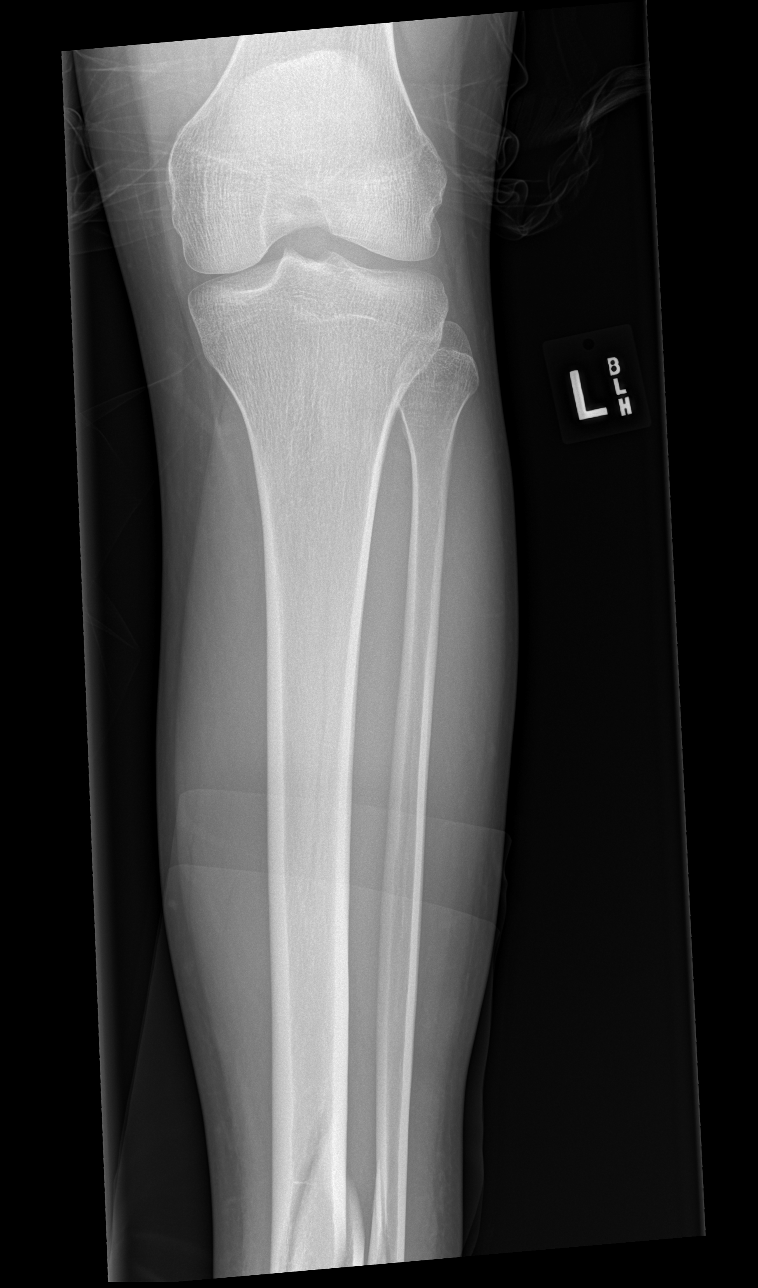

[x tib-fib ap left (2 of 2)]
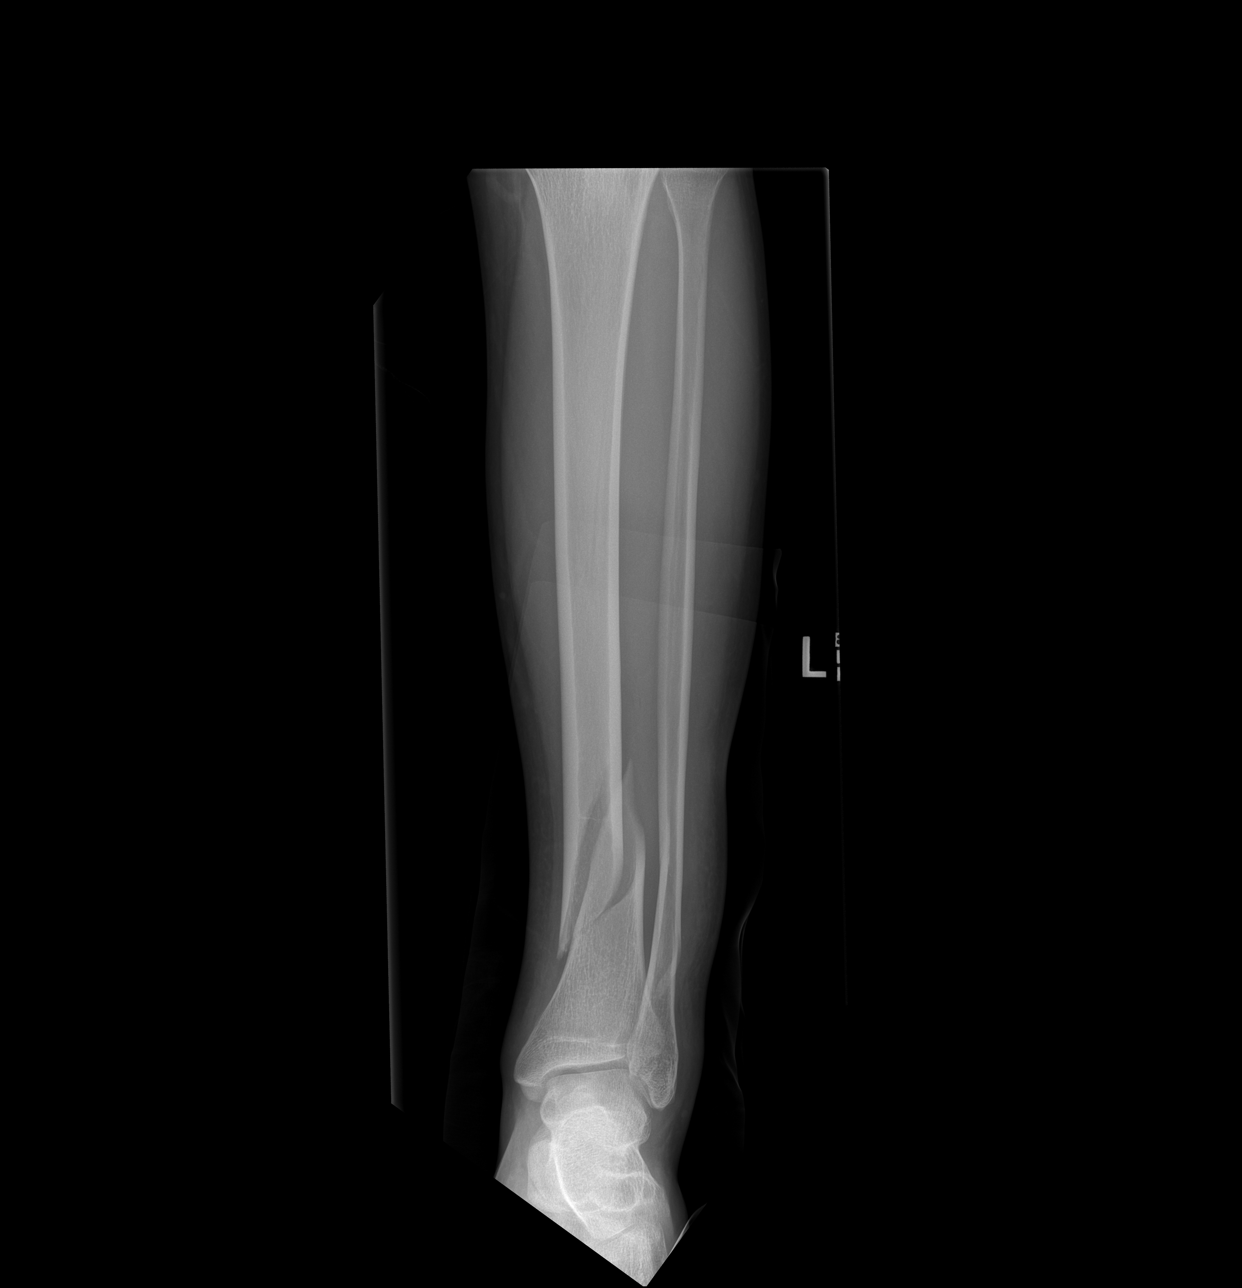

[x tib-fib lat left (1 of 2)]
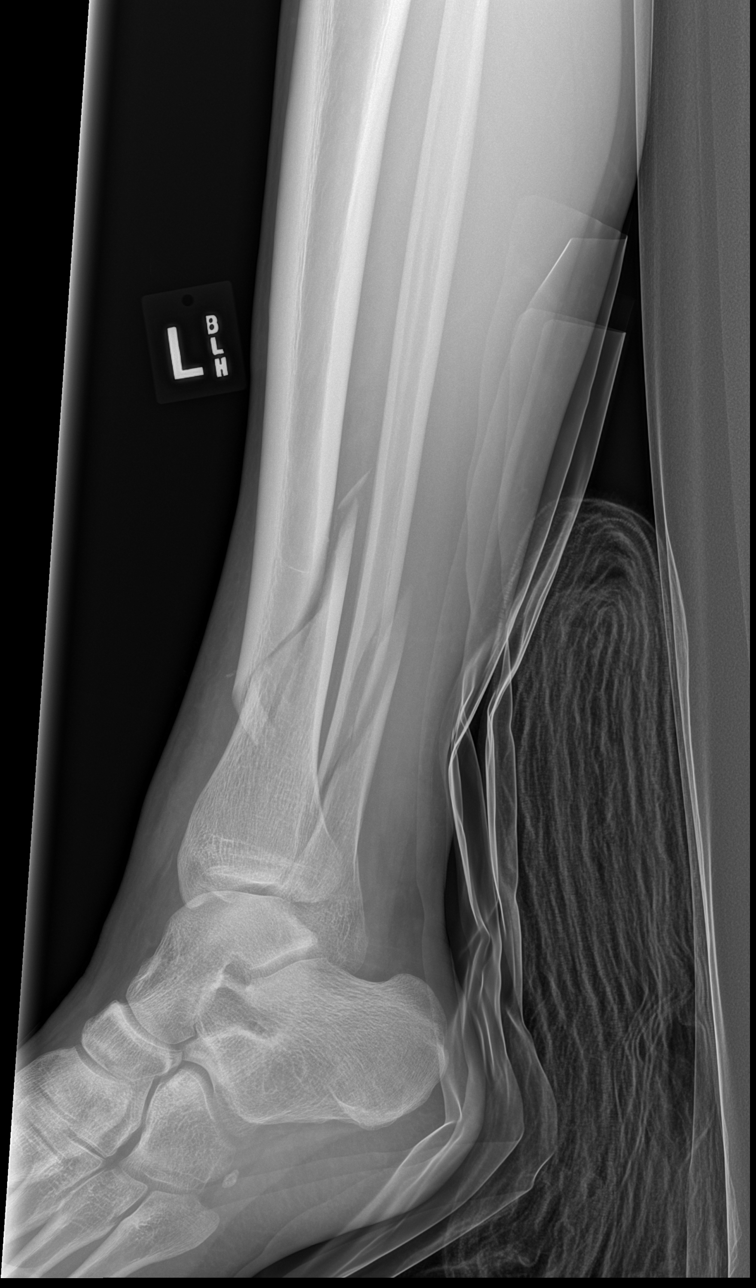

[x tib-fib lat left (2 of 2)]
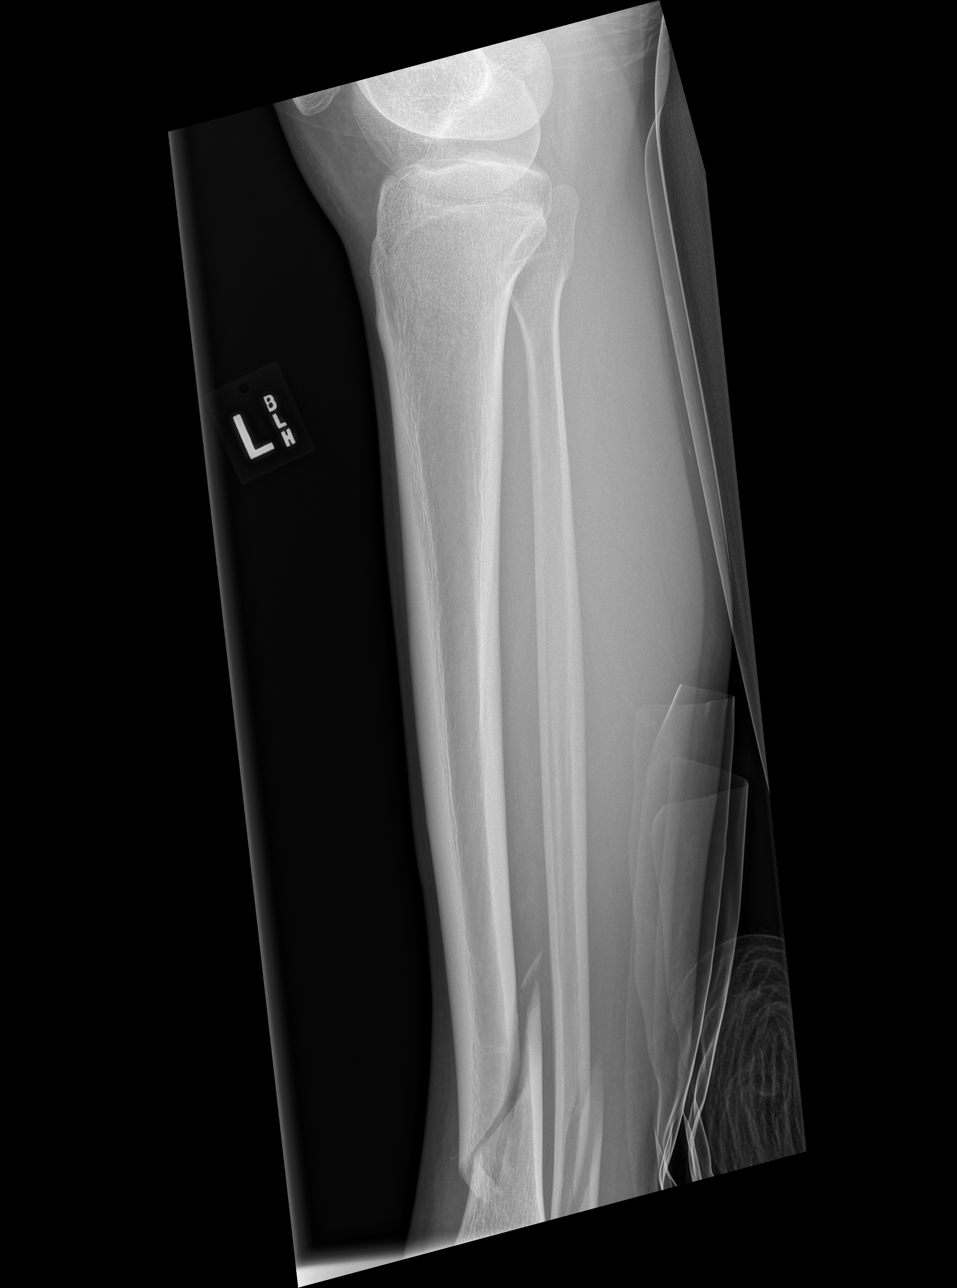

[4 of 4 positions shown; findings below may reference images not displayed]

FINDINGS: Comminuted non articular oblique left distal tibial metadiaphysis
fracture with 1 cm over riding and 9 mm posterior/lateral
displacement of the dominant distal fracture fragment. Comminuted
oblique left distal fibula fracture extending into the lateral
malleolus with 5 mm posterior displacement of the dominant distal
fracture fragment and 1 cm overriding. No additional fractures. No
dislocation at the knee or ankle. No suspicious focal osseous
lesions. No radiopaque foreign body. Soft tissue swelling
surrounding the fracture sites.
IMPRESSION: Comminuted left distal tibial and left distal fibula fractures as
detailed.

## 2020-02-23 IMAGING — RF LEFT TIBIA AND FIBULA - 2 VIEW
1 series · 8 of 8 positions shown · non-contrast
Comparison: Left ankle and tib-fib radiographs-10/06/2018

CLINICAL DATA: IM left tibial nail

EXAM:
DG C-ARM 61-120 MIN; LEFT TIBIA AND FIBULA - 2 VIEW
FLUOROSCOPY TIME:  2 minutes, 30 seconds

[Series 1: run · 8 of 8 slices shown]
[im 1/8]
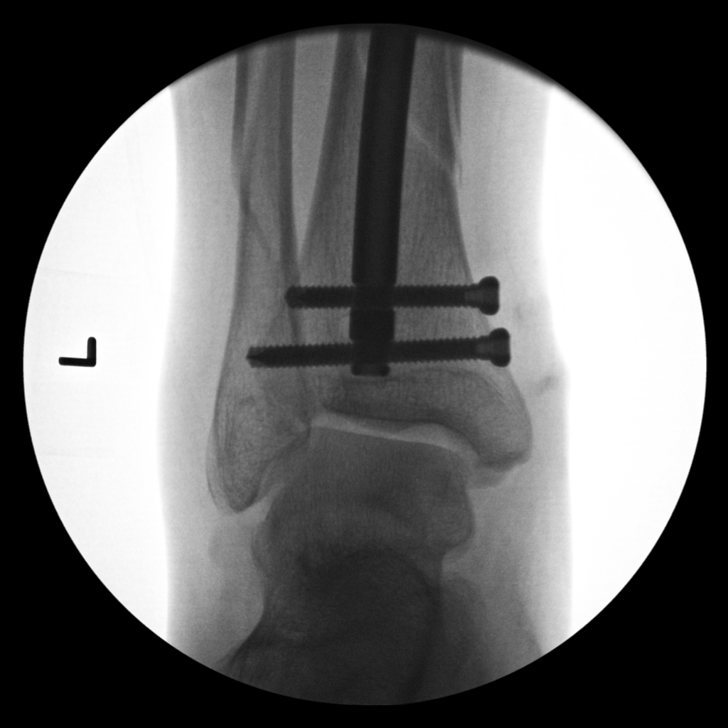
[im 2/8]
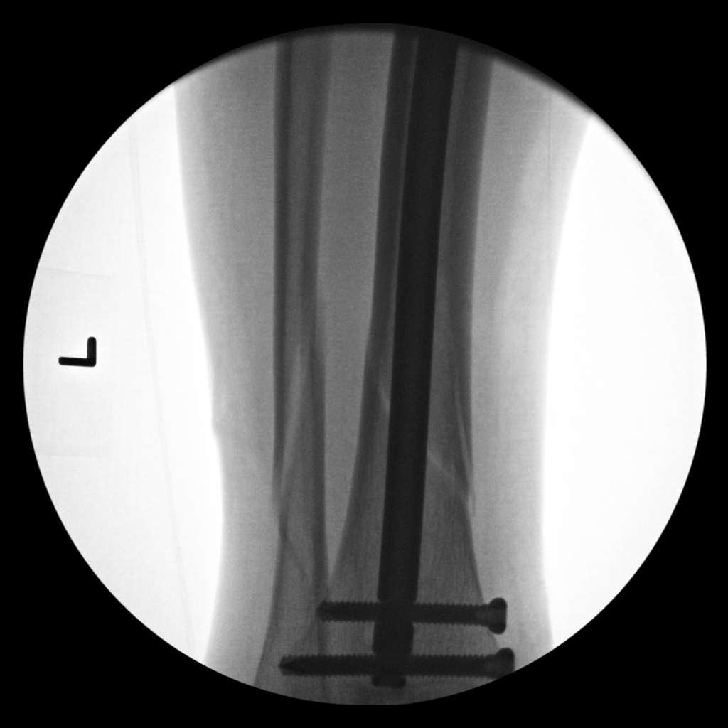
[im 3/8]
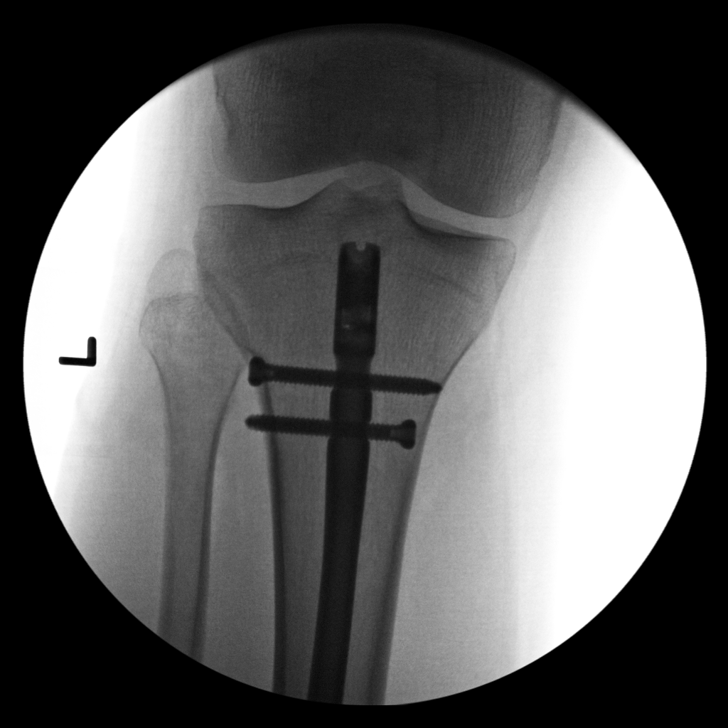
[im 4/8]
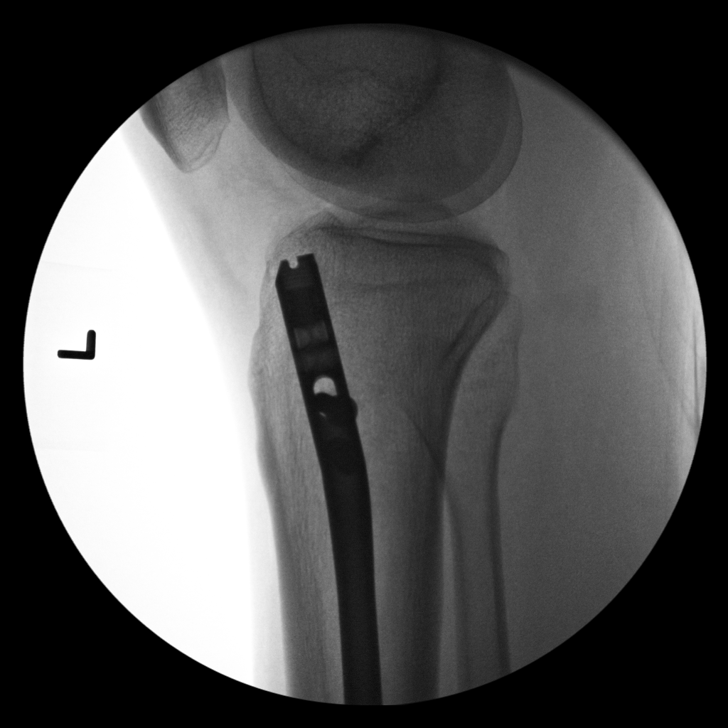
[im 5/8]
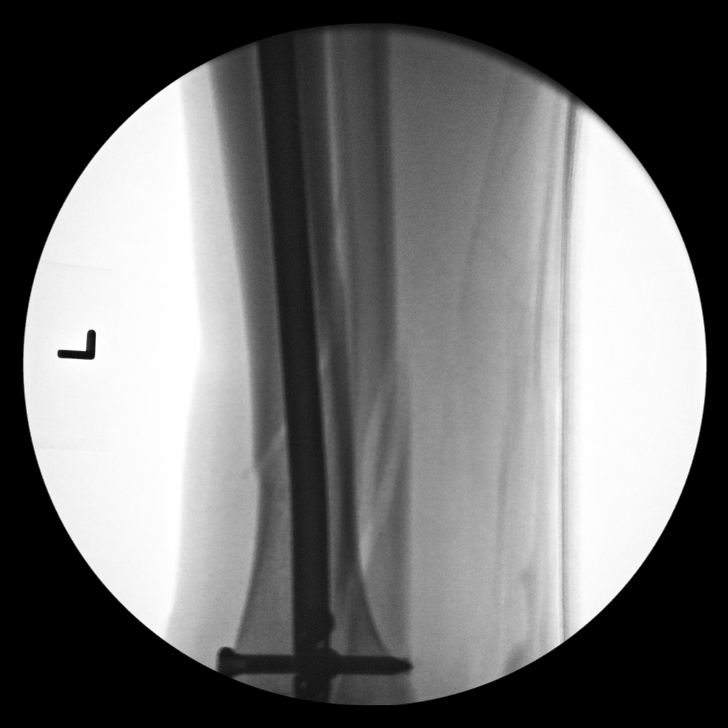
[im 6/8]
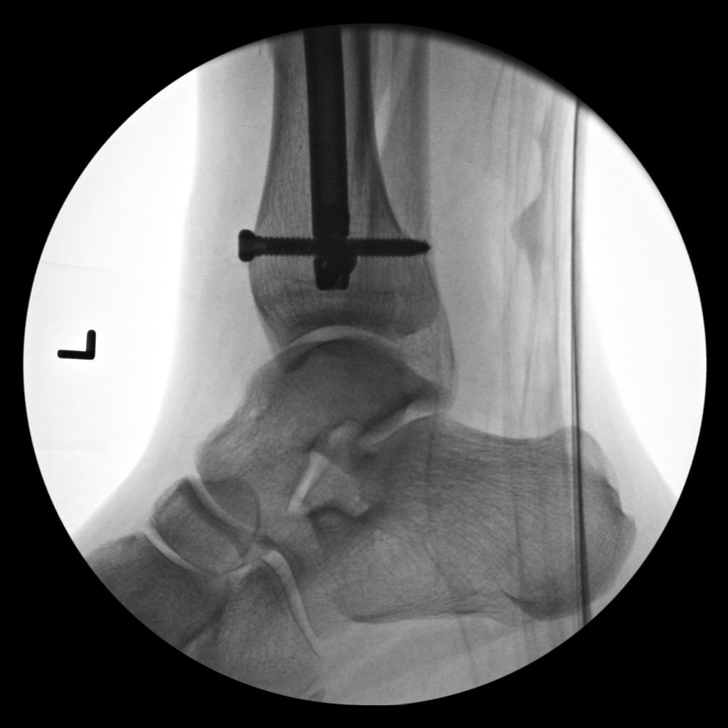
[im 7/8]
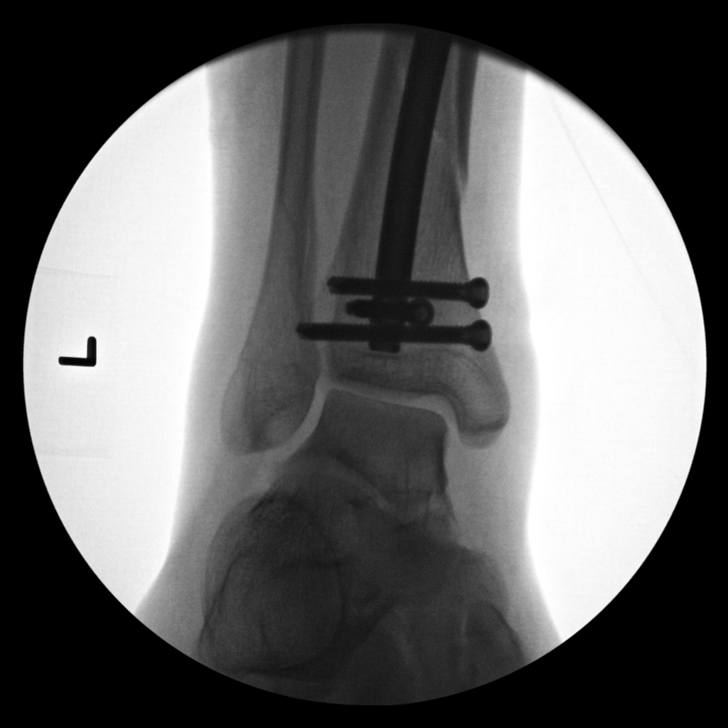
[im 8/8]
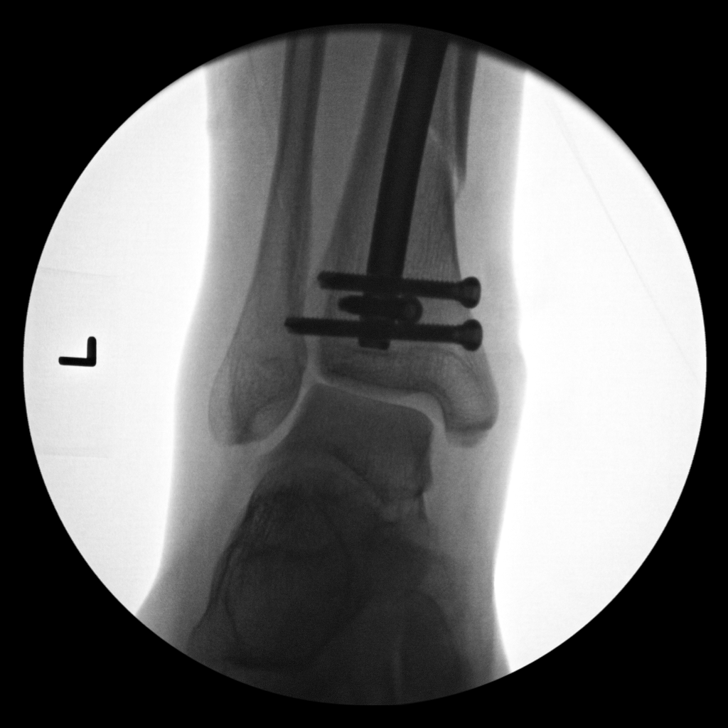

[8 of 8 positions shown; findings below may reference images not displayed]

FINDINGS: 8 spot intraoperative fluoroscopic images of the left tibia and
fibula are provided for review.

Images demonstrate the sequela of intramedullary rod fixation
transfixing known obliquely oriented distal tibial fracture. The
distal end of the tibial rod is transfixed with 3 cancellous screws
while the proximal and this transfixed with 2 cancellous screws.
Alignment appears near anatomic.

Improved alignment of known comminuted left fibular fracture without
dedicated ORIF.

Expected adjacent subcutaneous emphysema. No radiopaque foreign
body.
IMPRESSION: Post left tibial intramedullary rod fixation without evidence of
complication.
